# Patient Record
Sex: Female | Born: 2005 | Race: Black or African American | Hispanic: No | Marital: Single | Smoking: Never smoker
Health system: Southern US, Community
[De-identification: ages and names within clinical notes are randomized; demographics above are authoritative.]

## PROBLEM LIST (undated history)

## (undated) ENCOUNTER — Ambulatory Visit: Admission: EM | Disposition: A | Discharge status: Home / Self Care | Payer: Managed Care, Other (non HMO)

## (undated) ENCOUNTER — Ambulatory Visit: Source: Home / Self Care

## (undated) DIAGNOSIS — Z789 Other specified health status: Secondary | ICD-10-CM

## (undated) DIAGNOSIS — T7840XA Allergy, unspecified, initial encounter: Secondary | ICD-10-CM

## (undated) HISTORY — DX: Allergy, unspecified, initial encounter: T78.40XA

## (undated) HISTORY — DX: Other specified health status: Z78.9

## (undated) HISTORY — PX: NO PAST SURGERIES: SHX2092

---
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
## 2023-05-01 ENCOUNTER — Encounter: Payer: Self-pay | Admitting: Emergency Medicine

## 2023-05-01 ENCOUNTER — Ambulatory Visit
Admission: EM | Admit: 2023-05-01 | Discharge: 2023-05-01 | Disposition: A | Discharge status: Home / Self Care | Attending: Physician Assistant | Admitting: Physician Assistant

## 2023-05-01 DIAGNOSIS — Z113 Encounter for screening for infections with a predominantly sexual mode of transmission: Secondary | ICD-10-CM | POA: Insufficient documentation

## 2023-05-01 DIAGNOSIS — R102 Pelvic and perineal pain: Secondary | ICD-10-CM | POA: Diagnosis present

## 2023-05-01 LAB — POCT URINALYSIS DIP (MANUAL ENTRY)
Bilirubin, UA: NEGATIVE
Glucose, UA: NEGATIVE mg/dL
Ketones, POC UA: NEGATIVE mg/dL
Nitrite, UA: NEGATIVE
Protein Ur, POC: NEGATIVE mg/dL
Spec Grav, UA: 1.015 (ref 1.010–1.025)
Urobilinogen, UA: 0.2 U/dL
pH, UA: 7 (ref 5.0–8.0)

## 2023-05-01 LAB — POCT URINE PREGNANCY: Preg Test, Ur: NEGATIVE

## 2023-05-01 NOTE — ED Triage Notes (Signed)
 Pt reports cramping abdominal pain x5 days. Started having nausea and vomiting in the middle of the night. Last episode: 1 hr ago. Pt reports being 3 days late with her period. Pt has concerns for possible pregnancy or STI from her partner. No med use for symptoms.

## 2023-05-02 ENCOUNTER — Telehealth: Payer: Self-pay

## 2023-05-02 LAB — CERVICOVAGINAL ANCILLARY ONLY
Bacterial Vaginitis (gardnerella): POSITIVE — AB
Candida Glabrata: NEGATIVE
Candida Vaginitis: NEGATIVE
Chlamydia: POSITIVE — AB
Comment: NEGATIVE
Comment: NEGATIVE
Comment: NEGATIVE
Comment: NEGATIVE
Comment: NEGATIVE
Comment: NORMAL
Neisseria Gonorrhea: NEGATIVE
Trichomonas: NEGATIVE

## 2023-05-02 LAB — RPR: RPR Ser Ql: NONREACTIVE

## 2023-05-02 LAB — URINE CULTURE: Culture: <10000 — AB

## 2023-05-02 LAB — HIV ANTIBODY (ROUTINE TESTING W REFLEX): HIV Screen 4th Generation wRfx: NONREACTIVE

## 2023-05-02 MED ORDER — DOXYCYCLINE HYCLATE 100 MG PO TABS: 100.0000 mg | ORAL_TABLET | Freq: Two times a day (BID) | ORAL | 0 refills | Status: AC

## 2023-05-02 MED ORDER — METRONIDAZOLE 500 MG PO TABS: 500.0000 mg | ORAL_TABLET | Freq: Two times a day (BID) | ORAL | 0 refills | Status: AC

## 2023-05-02 NOTE — Telephone Encounter (Signed)
 Per protocol, pt requires tx with metronidazole and Doxycycline.  Attempted to reach patient x1. LVM.  Rx sent to pharmacy on file.

## 2023-05-05 NOTE — ED Provider Notes (Signed)
 [facility]    CSN: [id number] Arrival date & time: 05/01/23  6213      History   Chief Complaint Chief Complaint  Patient presents with   Abdominal Pain   Emesis    HPI Jaclyn Andrews is a 18 y.o. female.   Patient presents today for evaluation of lower abdominal cramping that started 5 days ago.  She also reports that last night she started to have some nausea and vomiting.  She states that her period is 3 days late.  She notes that she has some concerns for pregnancy or STD.  She denies any known exposures.  She has not taken any medication for symptoms.  The history is provided by the patient.  Abdominal Pain Associated symptoms: nausea and vomiting   Associated symptoms: no chills, no dysuria, no fever, no shortness of breath and no vaginal discharge   Emesis Associated symptoms: abdominal pain   Associated symptoms: no chills and no fever     Past Medical History:  Diagnosis Date   Allergy    COVID-19 09/21/2019   Medical history non-contributory     Patient Active Problem List   Diagnosis Date Noted   Mild preeclampsia delivered 06/21/2021   History of prior pregnancy with IUGR newborn 06/20/2021   Trauma during pregnancy 04/10/2021   Abdominal pain during intrauterine pregnancy 12/10/2020    Past Surgical History:  Procedure Laterality Date   CESAREAN SECTION N/A 06/21/2021   Procedure: CESAREAN SECTION;  Surgeon: Venora Maples, MD;  Location: [facility];  Service: Obstetrics;  Laterality: N/A;   NO PAST SURGERIES      OB History     Gravida  2   Para  1   Term  1   Preterm      AB  1   Living  1      SAB  1   IAB      Ectopic      Multiple  0   Live Births  1            Home Medications    Prior to Admission medications   Medication Sig Start Date End Date Taking? Authorizing Provider  doxycycline (VIBRA-TABS) 100 MG tablet Take 1 tablet (100 mg total) by mouth 2 (two) times daily for 7 days. 05/02/23  05/09/23  Zenia Resides, MD  doxycycline (VIBRAMYCIN) 100 MG capsule Take 1 capsule (100 mg total) by mouth 2 (two) times daily. Patient not taking: Reported on 05/01/2023 08/17/22   Merrilee Jansky, MD  metroNIDAZOLE (FLAGYL) 500 MG tablet Take 1 tablet (500 mg total) by mouth 2 (two) times daily for 7 days. 05/02/23 05/09/23  Zenia Resides, MD    Family History Family History  Problem Relation Age of Onset   Healthy Mother    Healthy Father     Social History Social History   Tobacco Use   Smoking status: Never   Smokeless tobacco: Never  Vaping Use   Vaping status: Former   Quit date: 04/11/2020   Substances: Nicotine  Substance Use Topics   Alcohol use: Yes    Comment: social   Drug use: Yes    Frequency: 7.0 times per week    Types: Marijuana     Allergies   Patient has no known allergies.   Review of Systems Review of Systems  Constitutional:  Negative for chills and fever.  Eyes:  Negative for discharge and redness.  Respiratory:  Negative for  shortness of breath.   Gastrointestinal:  Positive for abdominal pain, nausea and vomiting.  Genitourinary:  Positive for menstrual problem. Negative for dysuria and vaginal discharge.     Physical Exam Triage Vital Signs ED Triage Vitals [05/01/23 0958]  Encounter Vitals Group     BP 122/85     Systolic BP Percentile      Diastolic BP Percentile      Pulse Rate 74     Resp 16     Temp 97.6 F (36.4 C)     Temp Source Oral     SpO2 99 %     Weight      Height      Head Circumference      Peak Flow      Pain Score 3     Pain Loc      Pain Education      Exclude from Growth Chart    No data found.  Updated Vital Signs BP 122/85 (BP Location: Right Arm)   Pulse 74   Temp 97.6 F (36.4 C) (Oral)   Resp 16   LMP 03/31/2023 (Exact Date)   SpO2 99%   Breastfeeding No   Visual Acuity Right Eye Distance:   Left Eye Distance:   Bilateral Distance:    Right Eye Near:   Left Eye Near:     Bilateral Near:     Physical Exam Vitals and nursing note reviewed.  Constitutional:      General: She is not in acute distress.    Appearance: Normal appearance. She is not ill-appearing.  HENT:     Head: Normocephalic and atraumatic.  Eyes:     Conjunctiva/sclera: Conjunctivae normal.  Cardiovascular:     Rate and Rhythm: Normal rate.  Pulmonary:     Effort: Pulmonary effort is normal. No respiratory distress.  Abdominal:     General: Abdomen is flat. Bowel sounds are normal. There is no distension.     Tenderness: There is abdominal tenderness (mild TTP to lower abdomen/ suprapubic area). There is no guarding or rebound.  Neurological:     Mental Status: She is alert.  Psychiatric:        Mood and Affect: Mood normal.        Behavior: Behavior normal.        Thought Content: Thought content normal.      UC Treatments / Results  Labs (all labs ordered are listed, but only abnormal results are displayed) Labs Reviewed  URINE CULTURE - Abnormal; Notable for the following components:      Result Value   Culture   (*)    Value: <10,000 COLONIES/mL INSIGNIFICANT GROWTH Performed at [facility], [address] [place], [place] [address]    All other components within normal limits  POCT URINALYSIS DIP (MANUAL ENTRY) - Abnormal; Notable for the following components:   Color, UA light yellow (*)    Clarity, UA cloudy (*)    Blood, UA trace-intact (*)    Leukocytes, UA Trace (*)    All other components within normal limits  CERVICOVAGINAL ANCILLARY ONLY - Abnormal; Notable for the following components:   Chlamydia Positive (*)    Bacterial Vaginitis (gardnerella) Positive (*)    All other components within normal limits  POCT URINE PREGNANCY - Normal  RPR   Narrative:    Performed at:  [address] [facility] [address] [place], [place]  [id number] Lab Director: Jolene Schimke MD, Phone:  [phone]  HIV ANTIBODY (ROUTINE TESTING W REFLEX)   Narrative:     Performed at:  [address] [facility] [address], [place], [place]  [id number] Lab Director: Jolene Schimke MD, Phone:  [phone]    EKG   Radiology No results found.  Procedures Procedures (including critical care time)  Medications Ordered in UC Medications - No data to display  Initial Impression / Assessment and Plan / UC Course  I have reviewed the triage vital signs and the nursing notes.  Pertinent labs & imaging results that were available during my care of the patient were reviewed by me and considered in my medical decision making (see chart for details).   Will order urine culture as well as STD screening.  Will await results for further recommendation.  Pregnancy test negative in office.  Advised follow-up with any further concerns.   Final Clinical Impressions(s) / UC Diagnoses   Final diagnoses:  Suprapubic pain  Screening for STD (sexually transmitted disease)   Discharge Instructions   None    ED Prescriptions   None    PDMP not reviewed this encounter.   Tomi Bamberger, PA-C 05/05/23 1015

## 2023-05-19 ENCOUNTER — Telehealth (HOSPITAL_COMMUNITY): Payer: Self-pay

## 2023-05-19 MED ORDER — AZITHROMYCIN 250 MG PO TABS: 1000.0000 mg | ORAL_TABLET | Freq: Once | ORAL | 0 refills | Status: AC

## 2023-05-19 MED ORDER — METRONIDAZOLE 0.75 % VA GEL: 1.0000 | Freq: Every day | VAGINAL | 0 refills | Status: AC

## 2023-05-19 NOTE — Telephone Encounter (Signed)
 Pt states she has been unable to tolerate previous BV and chlamydia meds. Reviewed with patient, verified pharmacy, prescription sent

## 2023-06-26 ENCOUNTER — Encounter: Payer: Self-pay | Admitting: Family Medicine

## 2023-06-26 ENCOUNTER — Ambulatory Visit (INDEPENDENT_AMBULATORY_CARE_PROVIDER_SITE_OTHER): Admitting: Family Medicine

## 2023-06-26 VITALS — BP 113/72 | HR 96 | Wt 100.0 lb

## 2023-06-26 DIAGNOSIS — Z30016 Encounter for initial prescription of transdermal patch hormonal contraceptive device: Secondary | ICD-10-CM | POA: Insufficient documentation

## 2023-06-26 MED ORDER — NORELGESTROMIN-ETH ESTRADIOL 150-35 MCG/24HR TD PTWK: 1.0000 | MEDICATED_PATCH | TRANSDERMAL | 3 refills | Status: DC

## 2023-06-26 NOTE — Assessment & Plan Note (Signed)
 No recent unprotected sex. Discussed running packs together to not have menses. Does not smoke. Advised to wait until next period and then start about a week after.

## 2023-06-26 NOTE — Progress Notes (Signed)
   [facility] GYN[facility]Y OFFICE VISIT NOTE  History:    Shadia Ralph is a 18 y.o. G2P1011 here today for contraception counseling.  Has tried depo but didn't like it LMP 06/07/2023, last sexually active but used protection about two weeks ago (around a week after LMP) Interested in the patch She does not smoke, has never had a blood clot before  Health Maintenance Due  Topic Date Due   Meningococcal B Vaccine (1 of 2 - Standard) Never done    Past Medical History:  Diagnosis Date   Allergy    COVID-19 09/21/2019   Medical history non-contributory     Past Surgical History:  Procedure Laterality Date   CESAREAN SECTION N/A 06/21/2021   Procedure: CESAREAN SECTION;  Surgeon: Teena Feast, MD;  Location: [facility] LD ORS;  Service: Obstetrics;  Laterality: N/A;   NO PAST SURGERIES      The following portions of the patient's history were reviewed and updated as appropriate: allergies, current medications, past family history, past medical history, past social history, past surgical history and problem list.   Health Maintenance:   Last pap: No results found for: "DIAGPAP", "HPV", "HPVHIGH" N/a  Last mammogram:  N/a    Review of Systems:  Pertinent items noted in HPI and remainder of comprehensive ROS otherwise negative.  Physical Exam:  BP 113/72   Pulse 96   Wt 100 lb (45.4 kg)   BMI 19.53 kg/m  CONSTITUTIONAL: Well-developed, well-nourished female in no acute distress.  HEENT:  Normocephalic, atraumatic. External right and left ear normal. No scleral icterus.  NECK: Normal range of motion, supple, no masses noted on observation SKIN: No rash noted. Not diaphoretic. No erythema. No pallor. MUSCULOSKELETAL: Normal range of motion. No edema noted. NEUROLOGIC: Alert and oriented to person, place, and time. Normal muscle tone coordination.  PSYCHIATRIC: Normal mood and affect. Normal behavior. Normal judgment and thought content. RESPIRATORY: Effort  normal, no problems with respiration noted  Labs and Imaging No results found for this or any previous visit (from the past week). No results found.    Assessment and Plan:   Problem List Items Addressed This Visit       Other   Encounter for initial prescription of transdermal patch hormonal contraceptive device - Primary   No recent unprotected sex. Discussed running packs together to not have menses. Does not smoke. Advised to wait until next period and then start about a week after.       Relevant Medications   norelgestromin-ethinyl estradiol (XULANE) 150-35 MCG/24HR transdermal patch    Routine preventative health maintenance measures emphasized. Please refer to After Visit Summary for other counseling recommendations.   Return for as needed.    Total face-to-face time with patient: 20 minutes.  Over 50% of encounter was spent on counseling and coordination of care.   Teena Feast, MD/MPH Attending Family Medicine Physician, [facility] for [facility], [facility]

## 2023-08-16 IMAGING — US US OB TRANSVAGINAL
1 series · 15 of 28 positions shown · non-contrast
Comparison: 10/30/2020

CLINICAL DATA: Pregnant, unknown gestational age

EXAM:
OBSTETRIC <14 WK ULTRASOUND
TECHNIQUE: Transabdominal ultrasound was performed for evaluation of the
gestation as well as the maternal uterus and adnexal regions.

[Series 1: us ob transvaginal · 51 acquisitions, 15 frames shown]
[im 1/51]
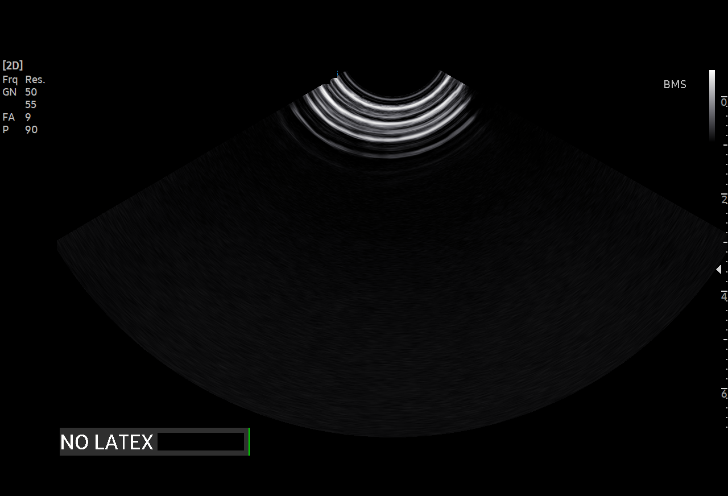
[im 4/51]
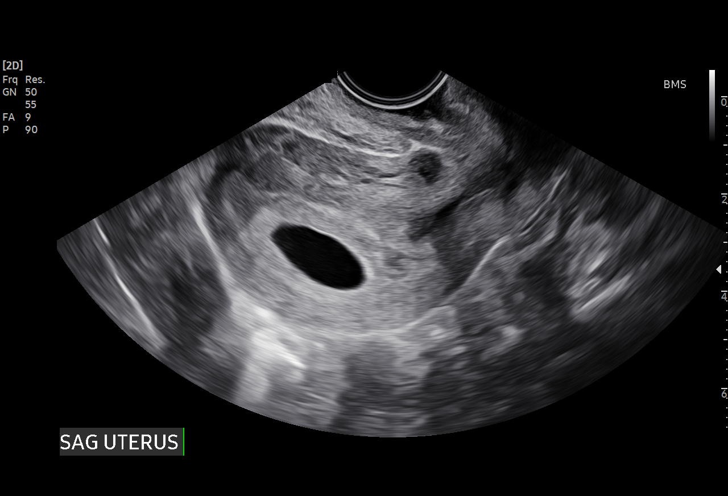
[im 8/51]
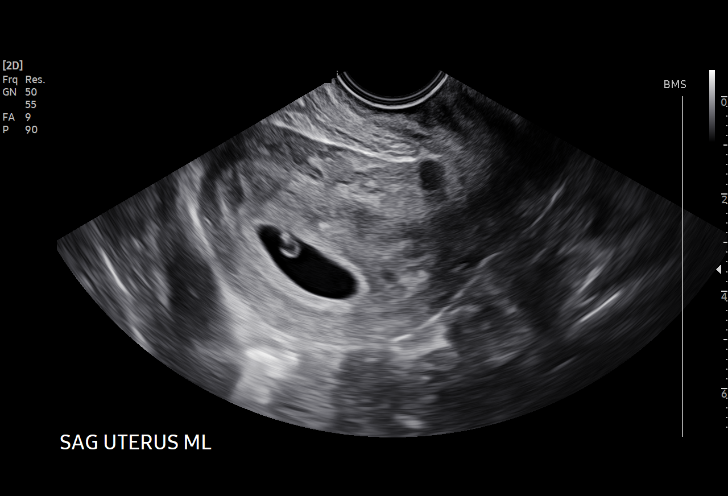
[im 12/51]
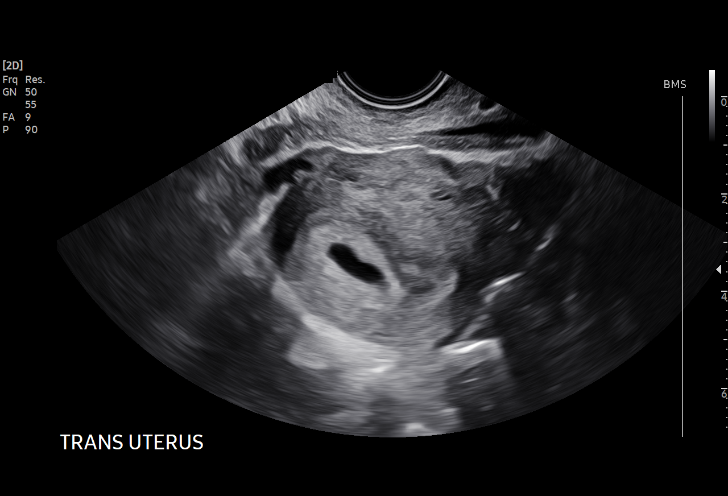
[im 15/51]
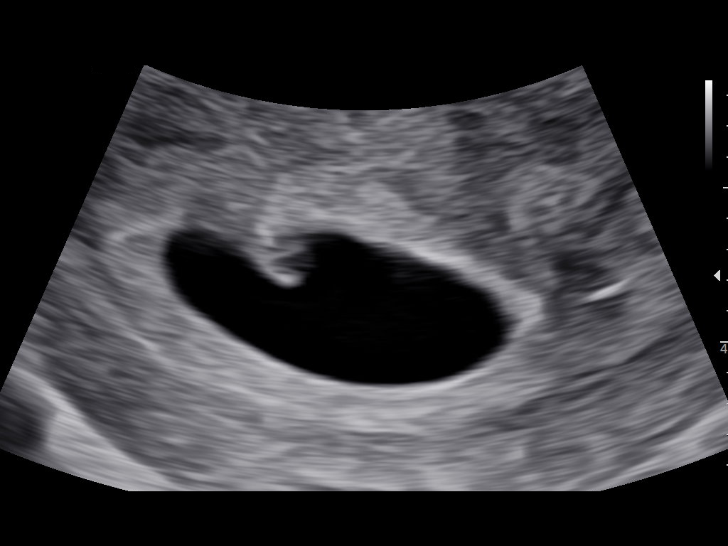
[im 19/51]
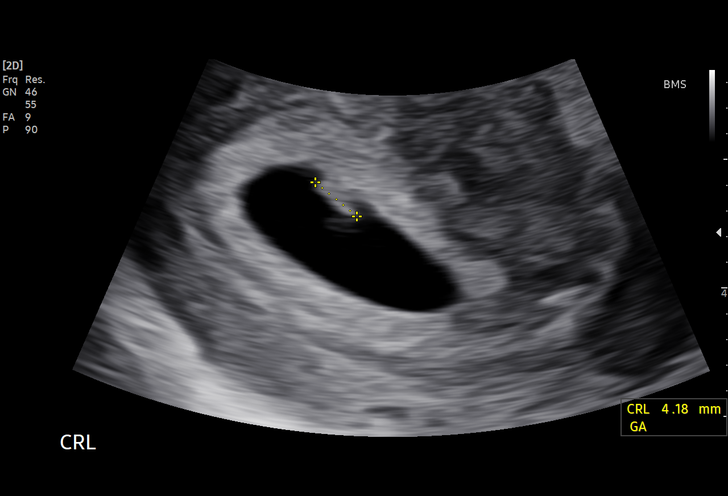
[im 23/51]
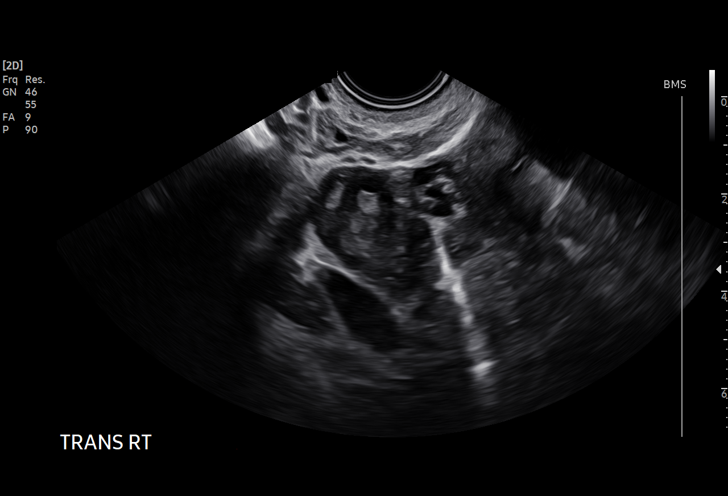
[im 26/51]
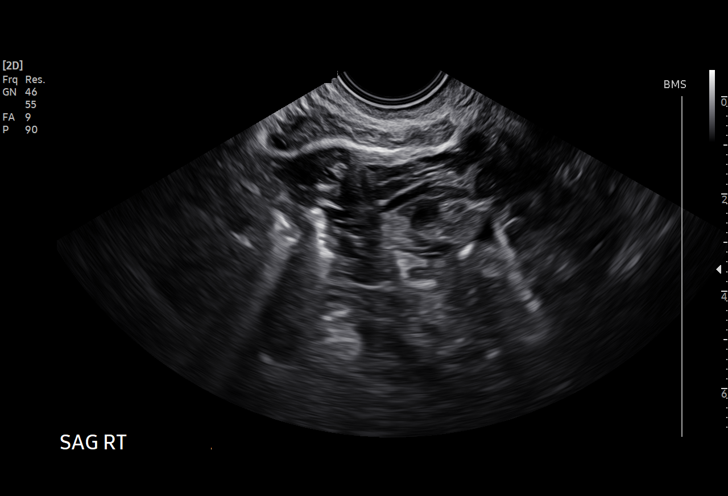
[im 28/51]
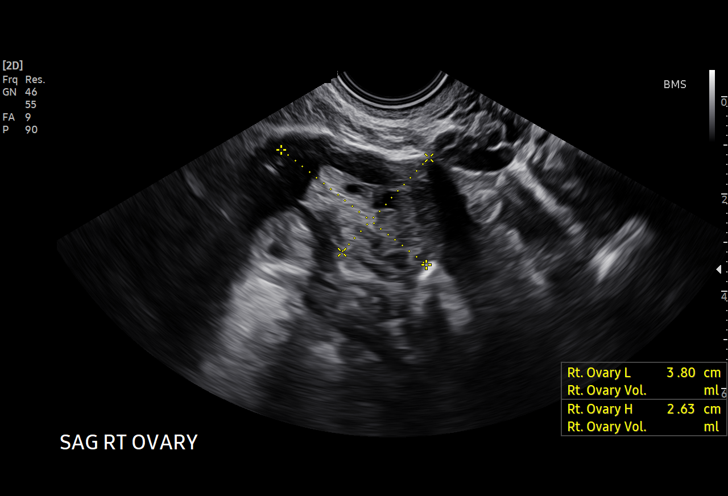
[im 32/51]
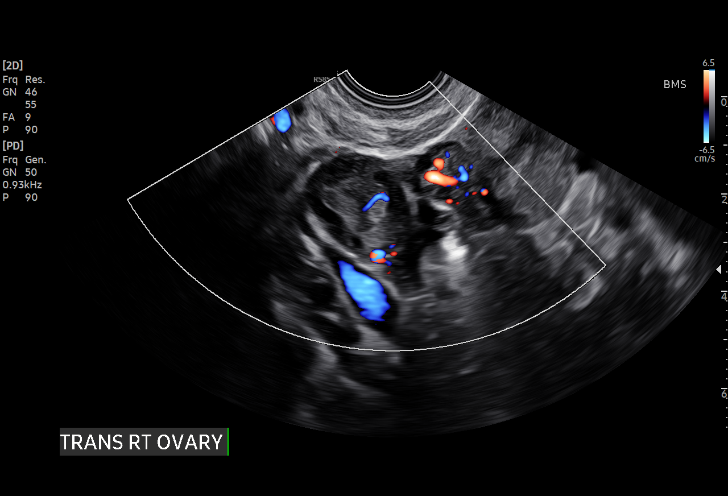
[im 36/51]
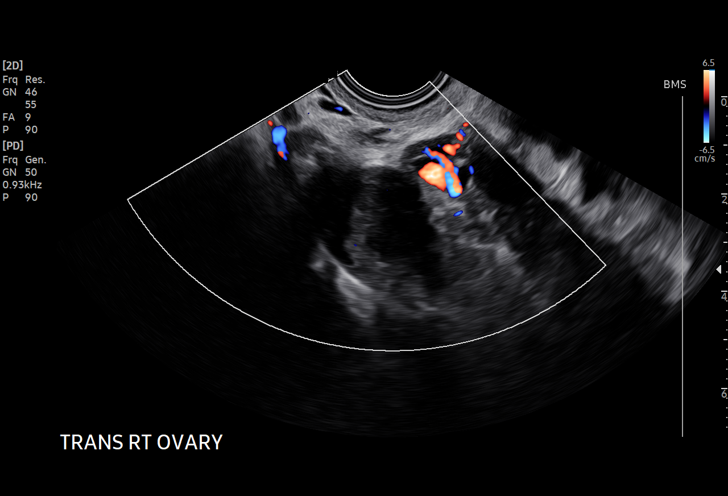
[im 39/51]
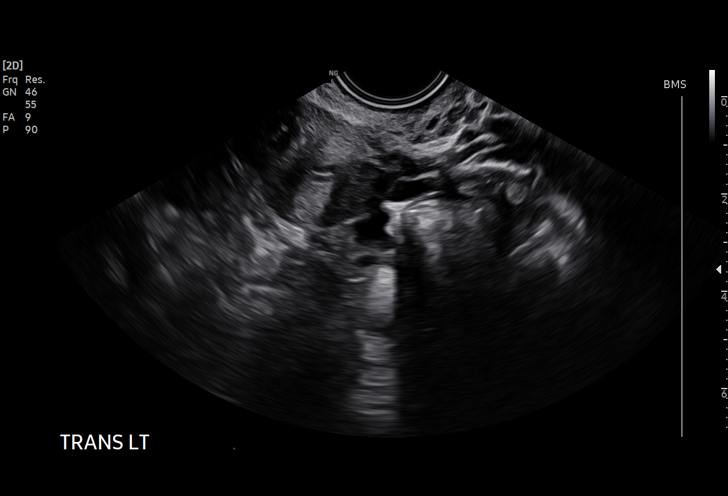
[im 43/51]
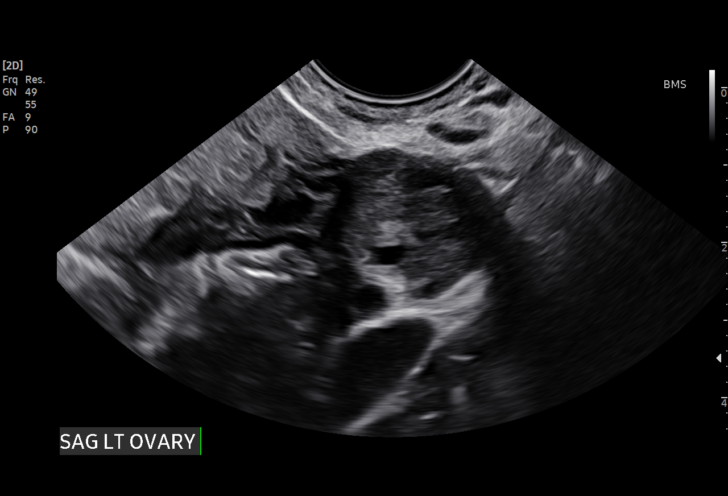
[im 47/51]
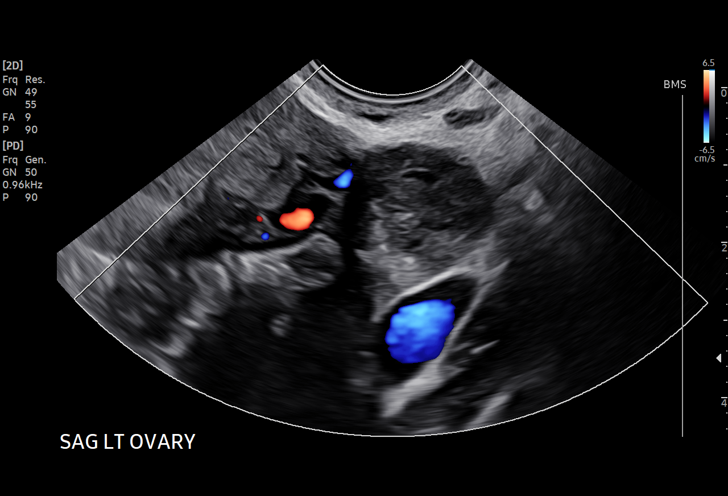
[im 51/51]
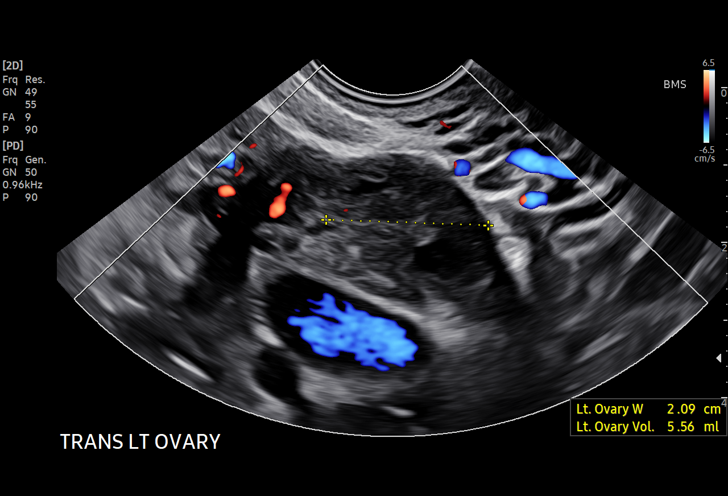

[15 of 28 positions shown; findings below may reference images not displayed]

FINDINGS: Intrauterine gestational sac: Single

Yolk sac:  Visualized.

Embryo:  Visualized.

Cardiac Activity: Visualized.

Heart Rate: 114 bpm

CRL:   4.1 mm   6 w 1 d                  US EDC: 07/04/2021

Subchorionic hemorrhage:  None visualized.

Maternal uterus/adnexae: Right ovary measures 3.8 x 2.6 x 2.5 cm
with a probable corpus luteum cyst. Left ovary measures 2.1 x 2.5 x
2.1 cm. No free fluid.
IMPRESSION: 1. Single live intrauterine pregnancy as above estimated age 6 weeks
and 1 day.

## 2023-09-16 IMAGING — US US ART/VEN ABD/PELV/SCROTUM DOPPLER LTD
1 series · 15 of 25 positions shown · non-contrast
Comparison: Prior ultrasound from 11/09/2020.

CLINICAL DATA: Initial evaluation for acute right lower quadrant
pain, evaluate for torsion.

EXAM:
OBSTETRIC <14 WK US OB
US DOPPLER ULTRASOUND OF OVARIES
TECHNIQUE: Transabdominal ultrasound examinations were performed for complete
evaluation of the gestation as well as the maternal uterus, adnexal
regions, and pelvic cul-de-sac.
Color and duplex Doppler ultrasound was utilized to evaluate blood
flow to the ovaries.

[Series 1: us art/ven abd/pelv/scrotum doppler ltd · 15 of 26 slices shown]
[im 1/26]
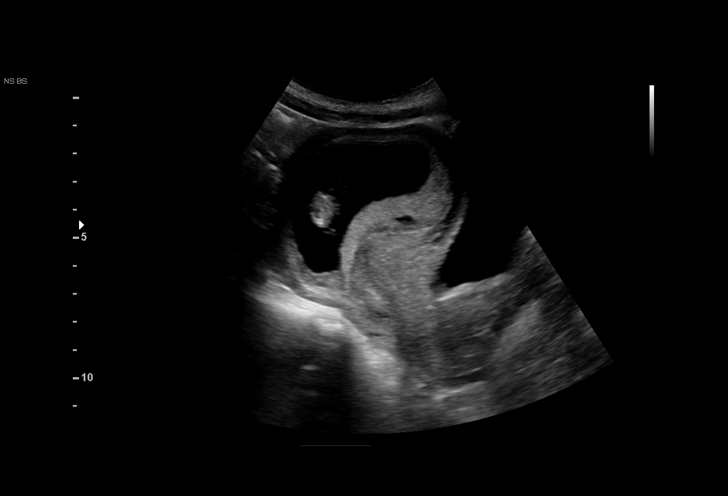
[im 3/26]
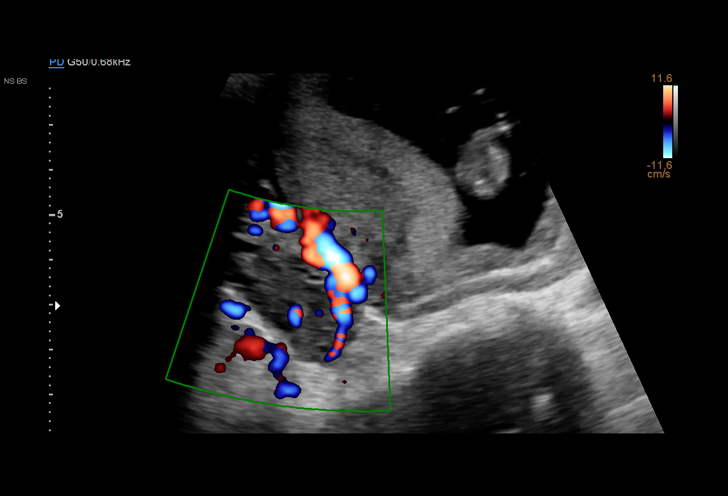
[im 5/26]
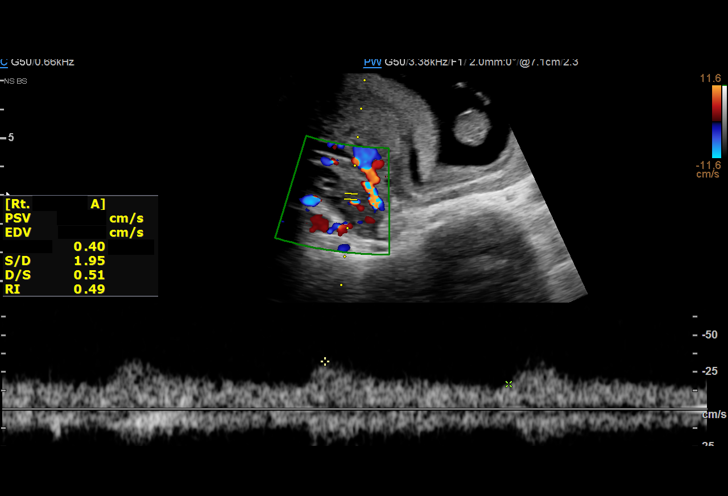
[im 6/26]
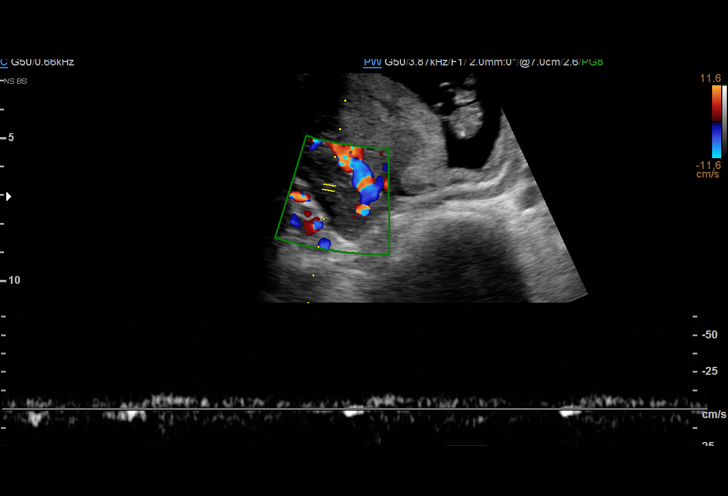
[im 8/26]
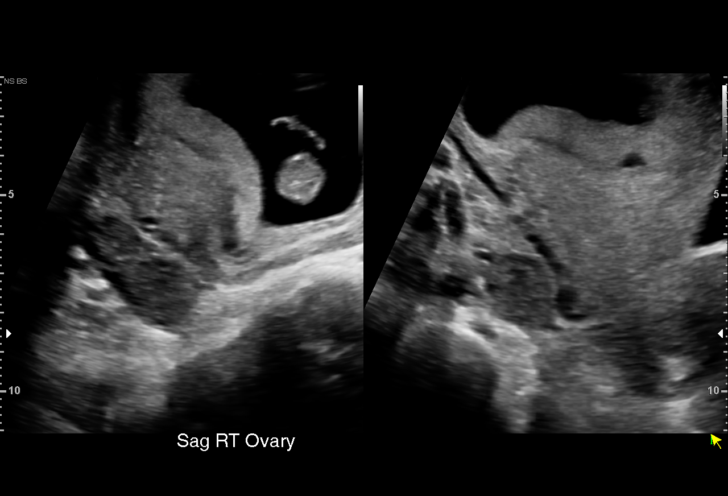
[im 10/26]
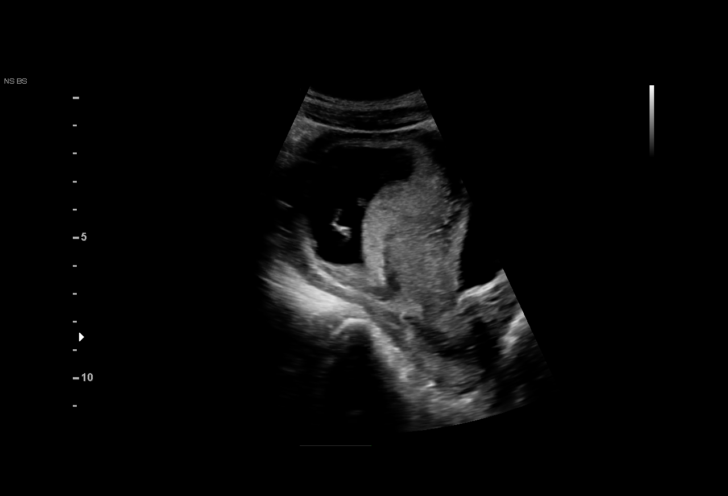
[im 11/26]
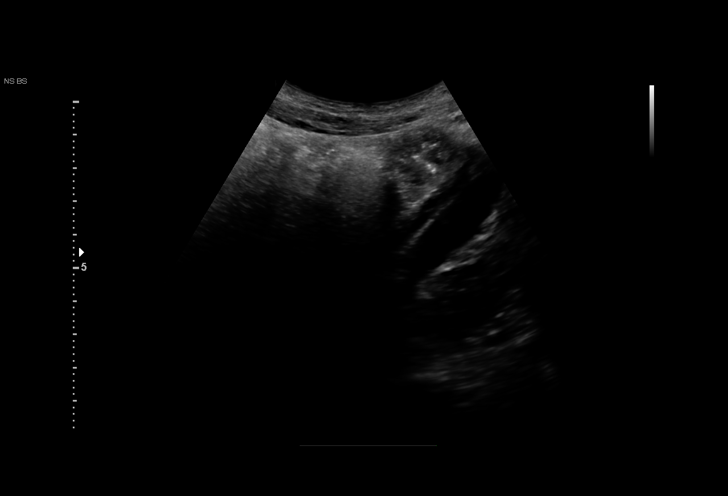
[im 13/26]
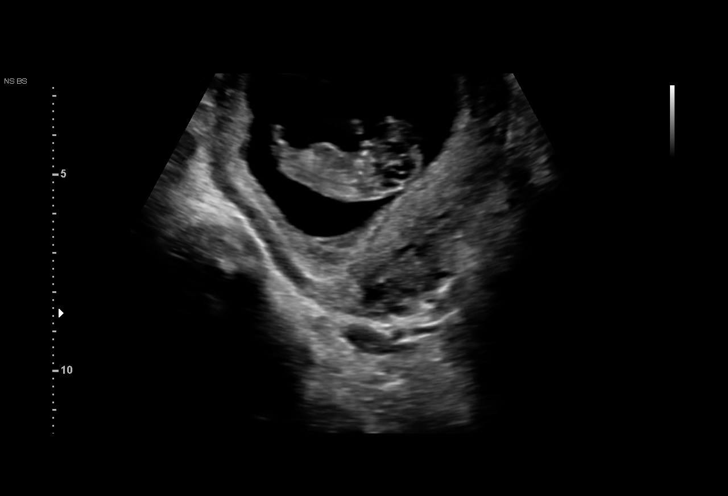
[im 15/26]
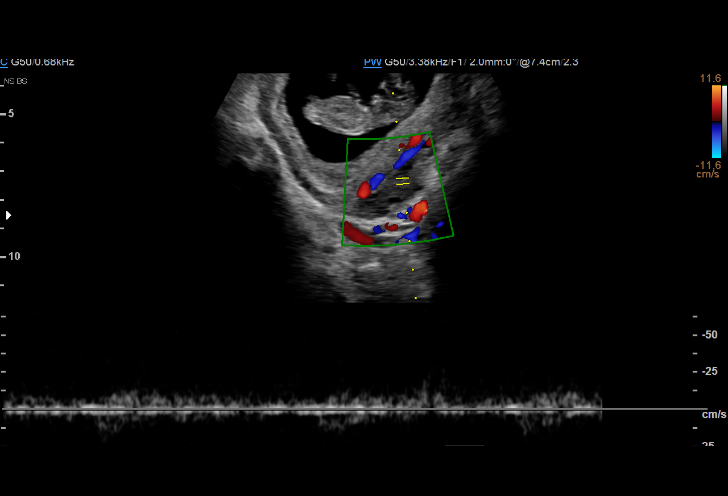
[im 16/26]
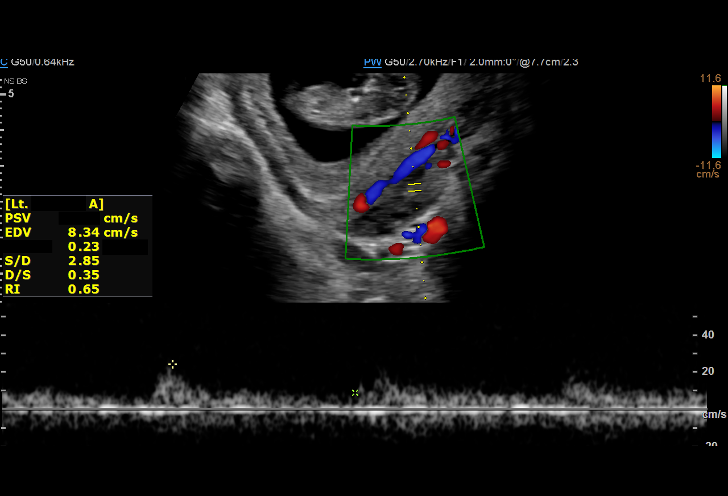
[im 18/26]
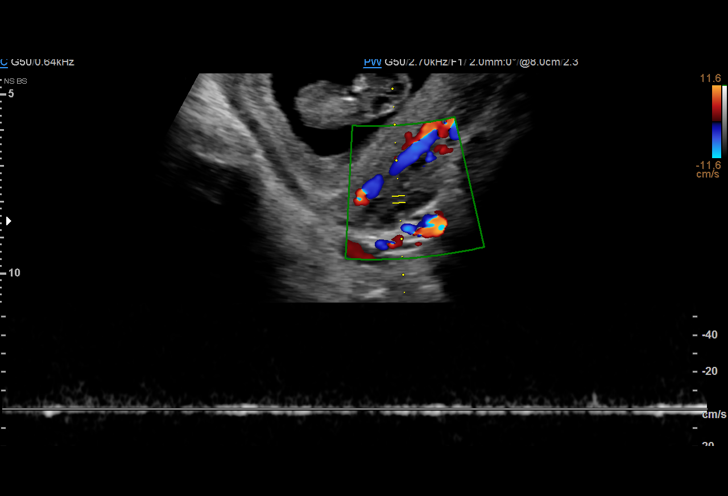
[im 20/26]
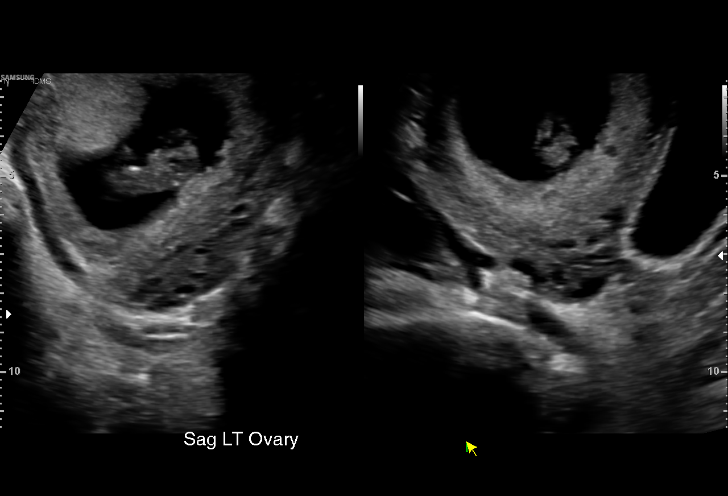
[im 21/26]
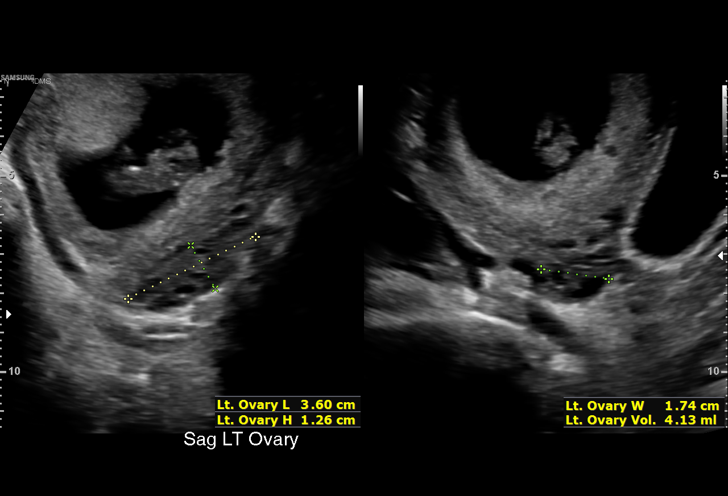
[im 23/26]
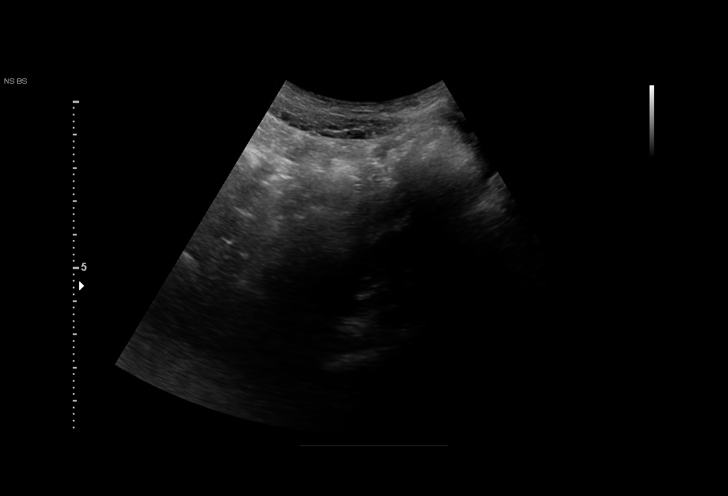
[im 26/26]
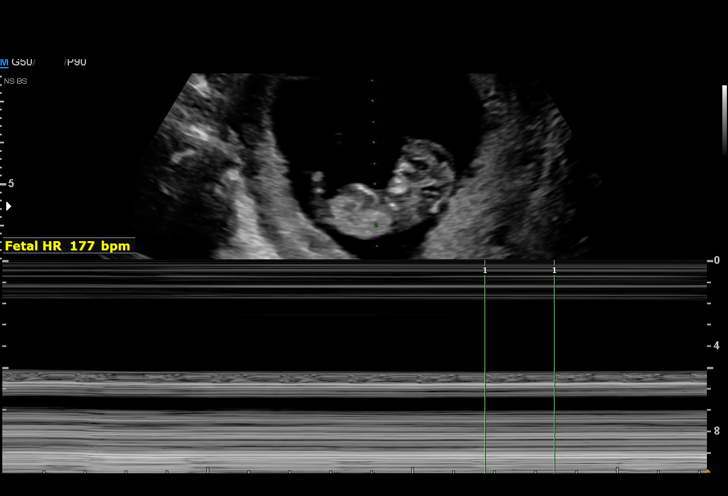

[15 of 25 positions shown; findings below may reference images not displayed]

FINDINGS: Intrauterine gestational sac: Present.

Yolk sac:  Present.

Embryo:  Present.

Cardiac Activity: Present.

Heart Rate: 177 bpm

CRL:   38.7 mm   10 w 5 d                  US EDC: 07/03/2021

Subchorionic hemorrhage:  None visualized.

Maternal uterus/adnexae:

Right ovary within normal limits measuring 3.5 x 1.7 x 1.8 cm,
volume = 5.6 mL.

Left ovary within normal limits measuring 3.6 x 1.3 x 1.7 cm, volume
= 4.1 mL.

No adnexal mass or free fluid.

Pulsed Doppler evaluation of both ovaries demonstrates normal
appearing low-resistance arterial and venous waveforms.
IMPRESSION: 1. Single viable IUP, estimated gestational age 10 weeks and 5 days.
No complication.
2. No other acute maternal uterine or adnexal abnormality. No
ovarian cyst or other adnexal mass. No evidence for torsion.

## 2023-09-16 IMAGING — US US ABDOMEN LIMITED
1 series · 15 of 25 positions shown · non-contrast
Comparison: None.

CLINICAL DATA: Initial evaluation for acute right upper and lower
quadrant abdominal pain. Early pregnancy.

EXAM:
ULTRASOUND ABDOMEN LIMITED RIGHT UPPER QUADRANT

[Series 1: us abdomen limited · 15 of 40 slices shown]
[im 1/40]
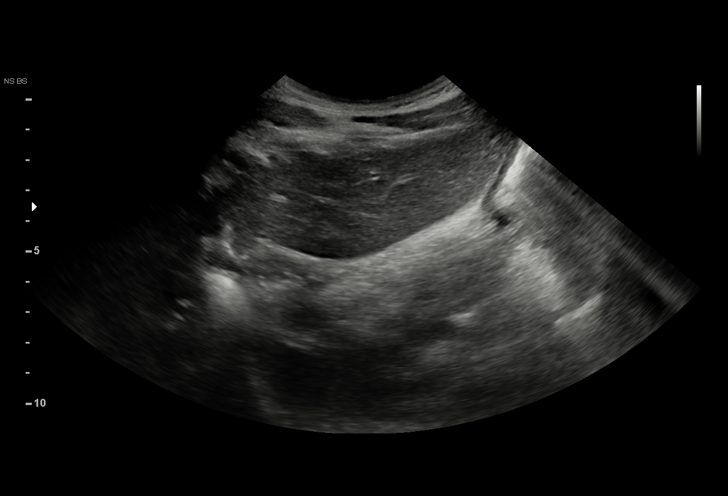
[im 4/40]
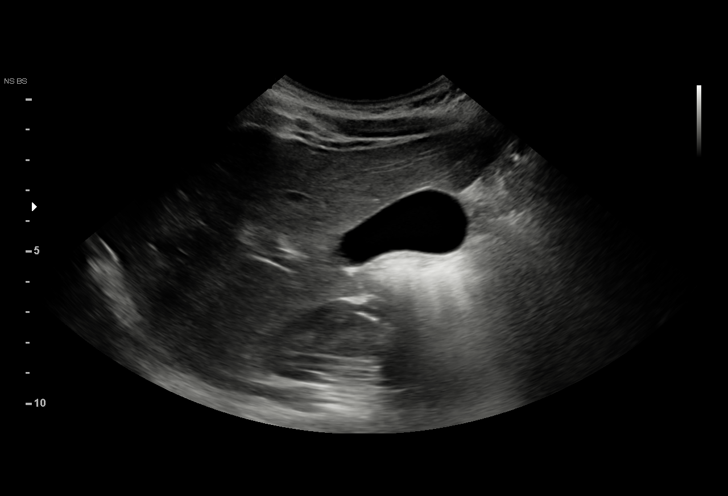
[im 7/40]
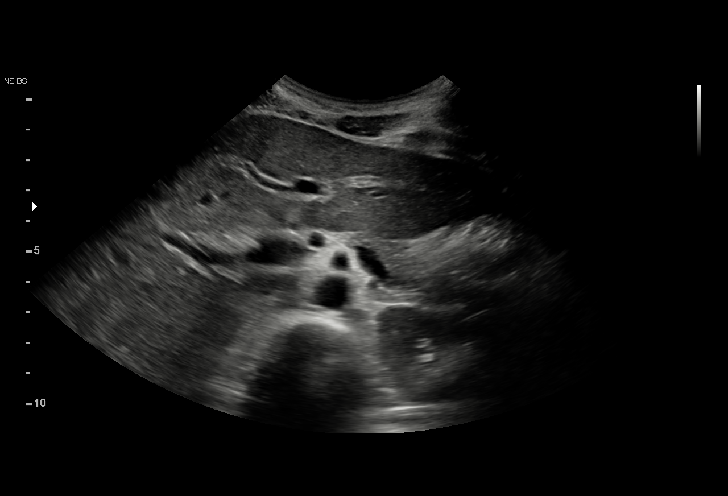
[im 9/40]
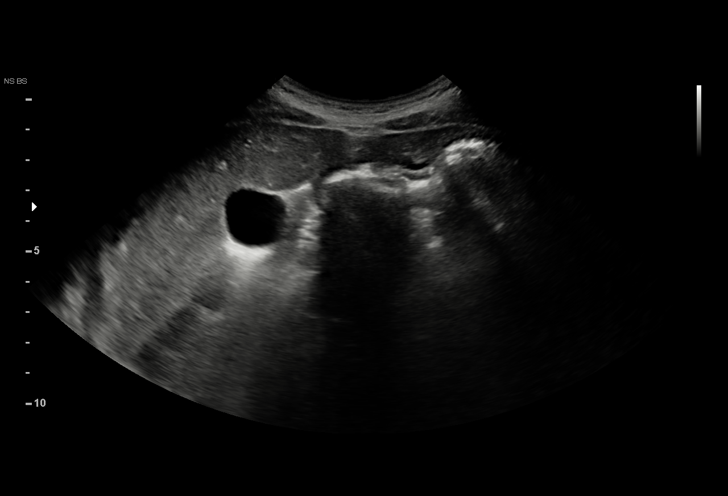
[im 12/40]
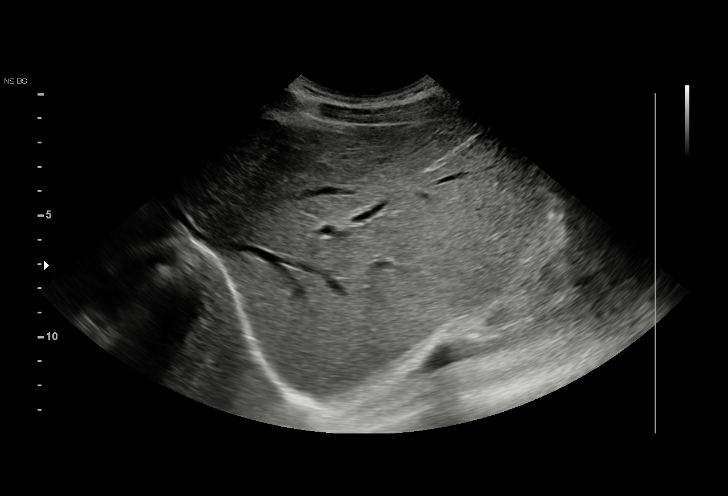
[im 15/40]
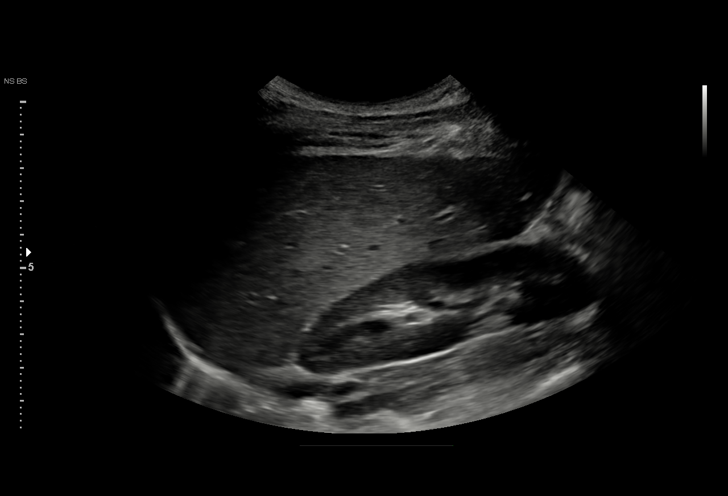
[im 17/40]
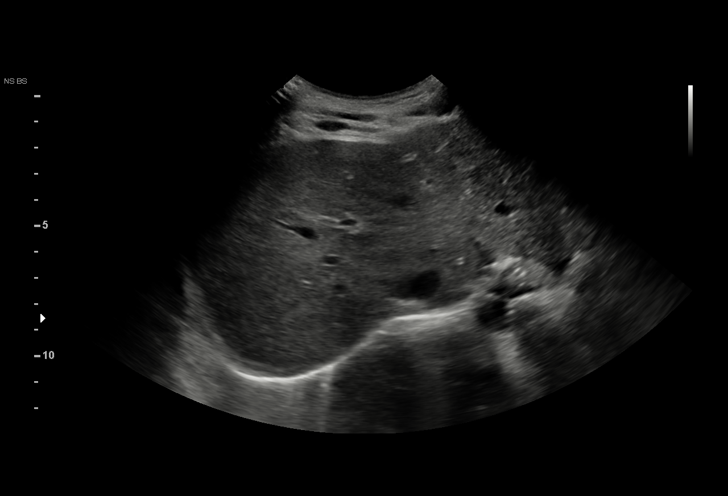
[im 20/40]
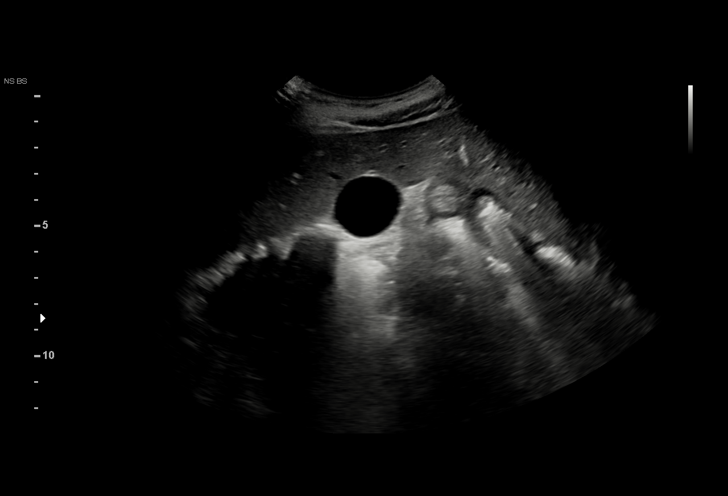
[im 23/40]
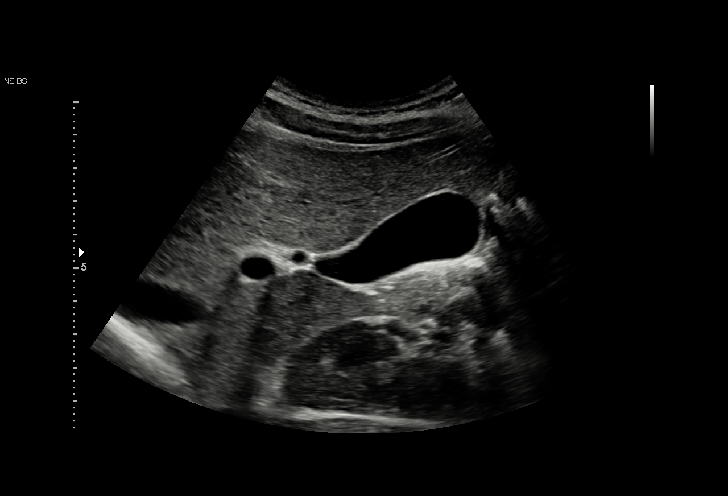
[im 25/40]
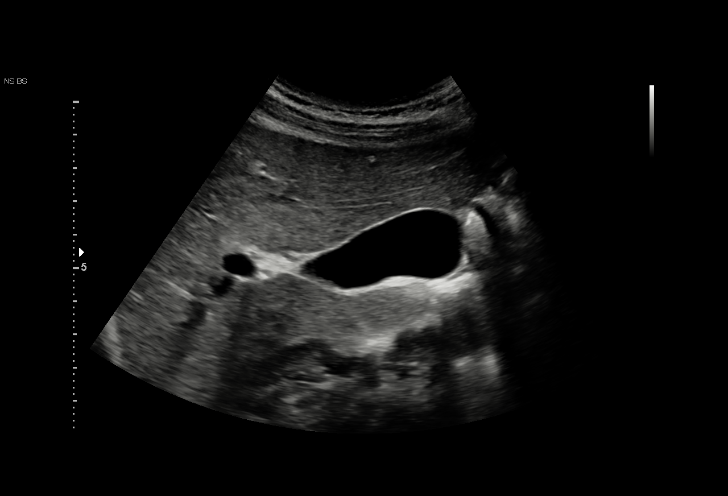
[im 28/40]
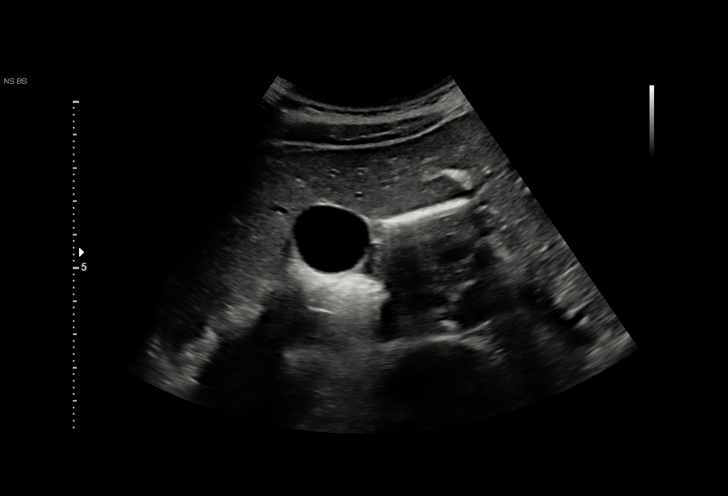
[im 31/40]
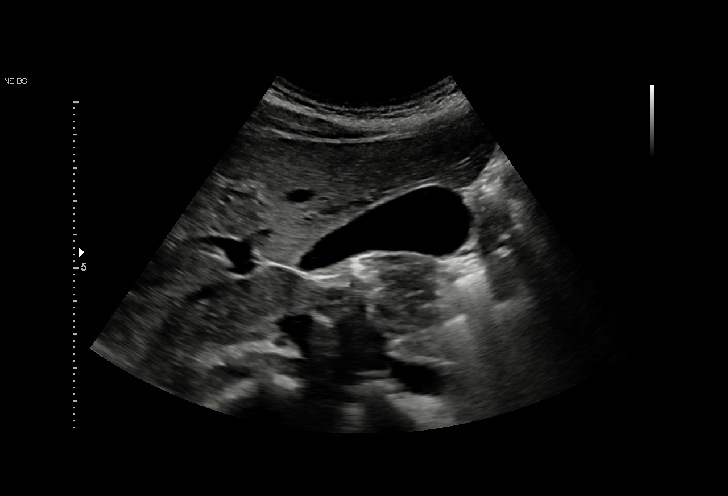
[im 33/40]
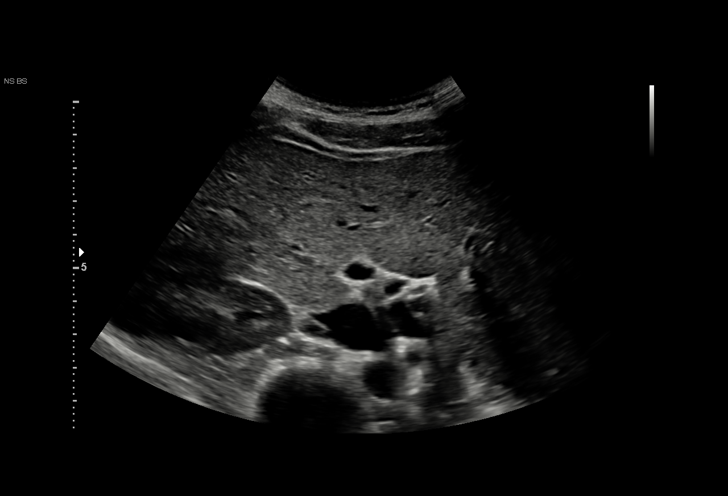
[im 36/40]
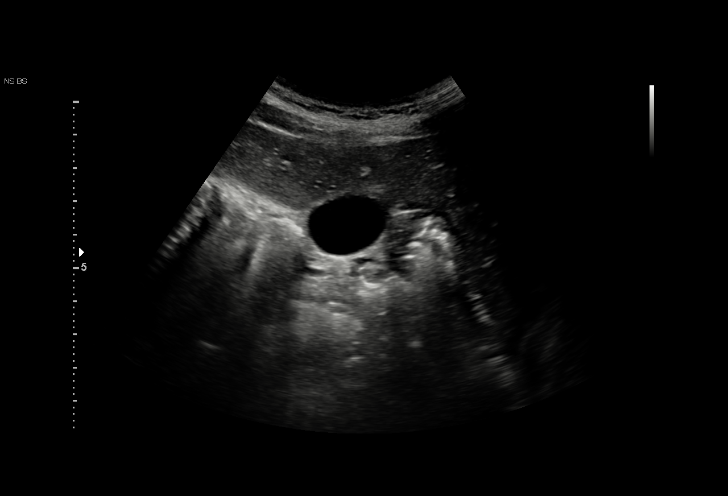
[im 40/40]
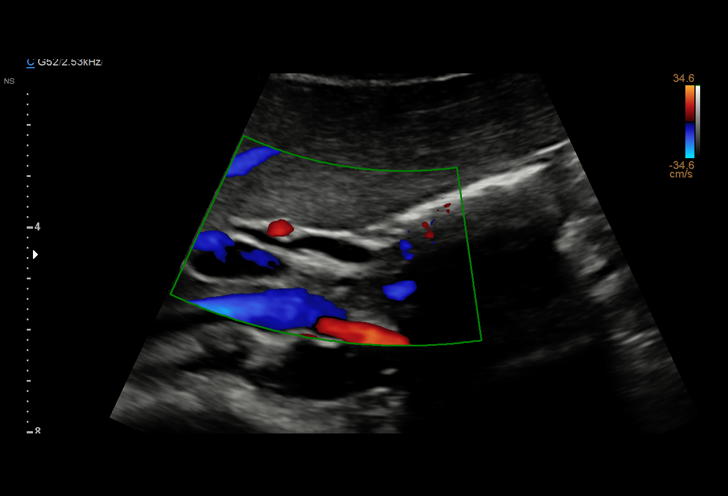

[15 of 25 positions shown; findings below may reference images not displayed]

FINDINGS: Gallbladder:

No gallstones or wall thickening visualized. No sonographic Murphy
sign noted by sonographer.

Common bile duct:

Diameter: 4 mm

Liver:

No focal lesion identified. Within normal limits in parenchymal
echogenicity. Portal vein is patent on color Doppler imaging with
normal direction of blood flow towards the liver.

Other: None.
IMPRESSION: Normal right upper quadrant ultrasound.

## 2023-10-23 ENCOUNTER — Other Ambulatory Visit: Payer: Self-pay

## 2023-10-23 ENCOUNTER — Emergency Department (HOSPITAL_COMMUNITY)
Admission: EM | Admit: 2023-10-23 | Discharge: 2023-10-23 | Disposition: A | Discharge status: Home / Self Care | Attending: Emergency Medicine | Admitting: Emergency Medicine

## 2023-10-23 ENCOUNTER — Emergency Department (HOSPITAL_COMMUNITY)

## 2023-10-23 ENCOUNTER — Encounter (HOSPITAL_COMMUNITY): Payer: Self-pay

## 2023-10-23 DIAGNOSIS — J069 Acute upper respiratory infection, unspecified: Secondary | ICD-10-CM | POA: Diagnosis not present

## 2023-10-23 DIAGNOSIS — R059 Cough, unspecified: Secondary | ICD-10-CM | POA: Diagnosis present

## 2023-10-23 LAB — BASIC METABOLIC PANEL WITH GFR
Anion gap: 12 (ref 5–15)
BUN: <5 mg/dL — ABNORMAL LOW (ref 6–20)
CO2: 20 mmol/L — ABNORMAL LOW (ref 22–32)
Calcium: 8.8 mg/dL — ABNORMAL LOW (ref 8.9–10.3)
Chloride: 104 mmol/L (ref 98–111)
Creatinine, Ser: 0.6 mg/dL (ref 0.44–1.00)
GFR, Estimated: >60 mL/min (ref >60)
Glucose, Bld: 99 mg/dL (ref 70–99)
Potassium: 3.9 mmol/L (ref 3.5–5.1)
Sodium: 136 mmol/L (ref 135–145)

## 2023-10-23 LAB — CBC
HCT: 38 % (ref 36.0–46.0)
Hemoglobin: 11.9 g/dL — ABNORMAL LOW (ref 12.0–15.0)
MCH: 27.5 pg (ref 26.0–34.0)
MCHC: 31.3 g/dL (ref 30.0–36.0)
MCV: 87.8 fL (ref 80.0–100.0)
Platelets: 243 K/uL (ref 150–400)
RBC: 4.33 MIL/uL (ref 3.87–5.11)
RDW: 12.8 % (ref 11.5–15.5)
WBC: 9.8 K/uL (ref 4.0–10.5)
nRBC: 0 % (ref 0.0–0.2)

## 2023-10-23 LAB — TROPONIN T, HIGH SENSITIVITY: Troponin T High Sensitivity: <15 ng/L (ref 0–19)

## 2023-10-23 LAB — HCG, SERUM, QUALITATIVE: Preg, Serum: NEGATIVE

## 2023-10-23 LAB — RESP PANEL BY RT-PCR (RSV, FLU A&B, COVID)  RVPGX2
Influenza A by PCR: NEGATIVE
Influenza B by PCR: NEGATIVE
Resp Syncytial Virus by PCR: NEGATIVE
SARS Coronavirus 2 by RT PCR: NEGATIVE

## 2023-10-23 MED ORDER — IBUPROFEN 800 MG PO TABS
800.0000 mg | ORAL_TABLET | Freq: Once | ORAL | Status: DC
  Filled 2023-10-23: qty 1

## 2023-10-23 MED ORDER — GUAIFENESIN-CODEINE 100-10 MG/5ML PO SOLN
5.0000 mL | Freq: Once | ORAL | Status: AC
  Administered 2023-10-23: 5 mL via ORAL
  Filled 2023-10-23: qty 5

## 2023-10-23 MED ORDER — PROMETHAZINE-DM 6.25-15 MG/5ML PO SYRP: 5.0000 mL | ORAL_SOLUTION | Freq: Four times a day (QID) | ORAL | 0 refills | Status: AC | PRN

## 2023-10-23 NOTE — Discharge Instructions (Addendum)
 Your work-up in the ER today was reassuring for acute findings. You were swabbed for COVID, flu, and RSV.  Which were negative. However, your symptoms are still likely related to an upper respiratory infection. As these are almost always viral in nature, no antibiotics are indicated. I recommend that you get plenty of rest and focus on symptomatic relief which includes Cepacol throat lozenges for sore throat, Mucinex  D (orange box) which you can get from behind the counter at your local pharmacy for congestion, and tylenol /ibuprofen  as needed for fevers and bodyaches.  Please use acetaminophen  (Tylenol ) or ibuprofen  (Advil , Motrin ) for pain.  You may use 800 mg ibuprofen  every 6 hours or 1000 mg of acetaminophen  every 6 hours.  You may choose to alternate between the two, this would be most effective. Do not exceed 4000 mg of acetaminophen  within 24 hours.  Do not exceed 3200 mg ibuprofen  within 24 hours.  I have also given you a prescription for cough syrup which is a cough suppressant medication for you to take as prescribed as needed for management of your symptoms.  Do not drive or operate heavy machinery while taking this medication as it can be sedating.  I also recommend:  Increased fluid intake. Sports drinks offer valuable electrolytes, sugars, and fluids.  Breathing heated mist or steam (vaporizer or shower).  Eating chicken soup or other clear broths, and maintaining good nutrition.   Increasing usage of your inhaler if you have asthma.  Return to work when your temperature has returned to normal.  Gargle warm salt water  and spit it out for sore throat. Take benadryl  or Zyrtec to decrease sinus secretions.  Follow Up: Follow up with your primary care doctor in 5-7 days for recheck of ongoing symptoms.  Return to emergency department for emergent changing or worsening of symptoms.

## 2023-10-23 NOTE — ED Provider Notes (Signed)
 [place] EMERGENCY DEPARTMENT AT [facility] Provider Note   CSN: [id number] Arrival date & time: 10/23/23  [id number]     Patient presents with: Chest Pain   Jaclyn Andrews is a 18 y.o. female.   Patient with no pertinent past medical history presents today with complaints of cough, congestion. Reports that symptoms began yesterday. She has been managing with Mucinex  and Nyquil with minimal improvement. Denies any fevers or chills. No sick contacts. Reports that today she was coughing and her chest started to hurt. Reports that her chest only hurts when she is coughing. Denies any shortness of breath. Does report generalized bodyaches. No leg pain or leg swelling, recent travel, or recent surgeries. No abdominal pain, nausea, vomiting, or diarrhea.   The history is provided by the patient. No language interpreter was used.  Chest Pain Associated symptoms: cough        Prior to Admission medications   Medication Sig Start Date End Date Taking? Authorizing Provider  doxycycline  (VIBRAMYCIN ) 100 MG capsule Take 1 capsule (100 mg total) by mouth 2 (two) times daily. Patient not taking: Reported on 05/01/2023 08/17/22   Blaise Aleene KIDD, MD  norelgestromin -ethinyl estradiol  (XULANE) 150-35 MCG/24HR transdermal patch Place 1 patch onto the skin once a week. 06/26/23   Lola Donnice HERO, MD    Allergies: Patient has no known allergies.    Review of Systems  HENT:  Positive for congestion.   Respiratory:  Positive for cough.   Cardiovascular:  Positive for chest pain.  All other systems reviewed and are negative.   Updated Vital Signs BP 117/85 (BP Location: Left Arm)   Pulse 95   Temp 98.3 F (36.8 C) (Oral)   Resp 18   SpO2 99%   Physical Exam Vitals and nursing note reviewed.  Constitutional:      General: She is not in acute distress.    Appearance: Normal appearance. She is normal weight. She is not ill-appearing, toxic-appearing or diaphoretic.  HENT:      Head: Normocephalic and atraumatic.  Cardiovascular:     Rate and Rhythm: Normal rate and regular rhythm.     Heart sounds: Normal heart sounds.  Pulmonary:     Effort: Pulmonary effort is normal. No respiratory distress.     Breath sounds: Normal breath sounds.  Chest:     Chest wall: No tenderness.  Abdominal:     Palpations: Abdomen is soft.     Tenderness: There is no abdominal tenderness.  Musculoskeletal:        General: Normal range of motion.     Cervical back: Normal range of motion.     Right lower leg: No tenderness. No edema.     Left lower leg: No tenderness. No edema.  Skin:    General: Skin is warm and dry.  Neurological:     General: No focal deficit present.     Mental Status: She is alert.  Psychiatric:        Mood and Affect: Mood normal.        Behavior: Behavior normal.     (all labs ordered are listed, but only abnormal results are displayed) Labs Reviewed  BASIC METABOLIC PANEL WITH GFR - Abnormal; Notable for the following components:      Result Value   CO2 20 (*)    BUN <5 (*)    Calcium  8.8 (*)    All other components within normal limits  CBC - Abnormal; Notable for the following components:  Hemoglobin 11.9 (*)    All other components within normal limits  RESP PANEL BY RT-PCR (RSV, FLU A&B, COVID)  RVPGX2  HCG, SERUM, QUALITATIVE  TROPONIN T, HIGH SENSITIVITY    EKG: None  Radiology: DG Chest 2 View Result Date: 10/23/2023 CLINICAL DATA:  18 year old female with chest pain cough and congestion. EXAM: DG CHEST 2V COMPARISON:  Portable chest 04/09/2021 and earlier. FINDINGS: Semi upright AP and lateral views of the chest. Improved lung volumes. Normal cardiac size and mediastinal contours. Visualized tracheal air column is within normal limits. Both lungs appear clear. No pneumothorax or pleural effusion. Negative visible bowel gas and osseous structures. IMPRESSION: Negative. No cardiopulmonary abnormality. Electronically Signed   By: VEAR Hurst M.D.   On: 10/23/2023 07:13     Procedures   Medications Ordered in the ED  guaiFENesin -codeine  100-10 MG/5ML solution 5 mL (has no administration in time range)  ibuprofen  (ADVIL ) tablet 800 mg (has no administration in time range)                                    Medical Decision Making Risk OTC drugs. Prescription drug management.   This patient is a 18 y.o. female who presents to the ED for concern of cough, congestion, this involves an extensive number of treatment options, and is a complaint that carries with it a high risk of complications and morbidity. The emergent differential diagnosis prior to evaluation includes, but is not limited to,  URI, pneumonia . This is not an exhaustive differential.   Past Medical History / Co-morbidities / Social History:  has a past medical history of Allergy, COVID-19 (09/21/2019), and Medical history non-contributory.  Additional history: Chart reviewed.  Physical Exam: Physical exam performed. The pertinent findings include: well appearing, no acute physical exam abnormalities  Lab Tests: I ordered, and personally interpreted labs.  The pertinent results include:  no acute laboratory abnormalities   Imaging Studies: I ordered imaging studies including CXR. I independently visualized and interpreted imaging which showed NAD. I agree with the radiologist interpretation.   Cardiac Monitoring:  The patient was maintained on a cardiac monitor.  My attending physician viewed and interpreted the cardiac monitored which showed an underlying rhythm of: sinus rhythm, no STEMI. I agree with this interpretation.   Medications: I ordered medication including ibuprofen , cough syrup  for cough, congestion. Reevaluation of the patient after these medicines showed that the patient improved. I have reviewed the patients home medicines and have made adjustments as needed.   Disposition: After consideration of the diagnostic results and the  patients response to treatment, I feel that emergency department workup does not suggest an emergent condition requiring admission or immediate intervention beyond what has been performed at this time. The plan is: discharge with cough syrup, close outpatient follow-up and return precautions. Patient advised not to drive or operate heavy machinery while taking this medication.  Patients workup is benign, after above interventions she is feeling better and ready to go home.  Patient's symptoms are consistent with a viral illness.  No signs or symptoms to suggest ACS/PE.  Evaluation and diagnostic testing in the emergency department does not suggest an emergent condition requiring admission or immediate intervention beyond what has been performed at this time.  Plan for discharge with close PCP follow-up.  Patient is understanding and amenable with plan, educated on red flag symptoms that would prompt immediate return.  Patient discharged in stable condition.  Final diagnoses:  Viral URI with cough    ED Discharge Orders          Ordered    promethazine -dextromethorphan (PROMETHAZINE -DM) 6.25-15 MG/5ML syrup  4 times daily PRN        10/23/23 0818          An After Visit Summary was printed and given to the patient.      Nora Lauraine LABOR, PA-C 10/23/23 0820    Elnor Jayson LABOR, DO 10/24/23 [phone]

## 2023-10-23 NOTE — ED Triage Notes (Signed)
 Pt. Arrives pov c/o congestion and chest pain. States that she was sick all day yesterday with body aches and sore throat. Today her chest pain woke her out of her sleep. Denies SOB.

## 2023-12-12 IMAGING — US US MFM OB FOLLOW-UP
1 series · 14 of 28 positions shown · non-contrast
Comparison: none

[Series 1: us mfm ob follow-up · 14 of 60 slices shown]
[im 3/60]
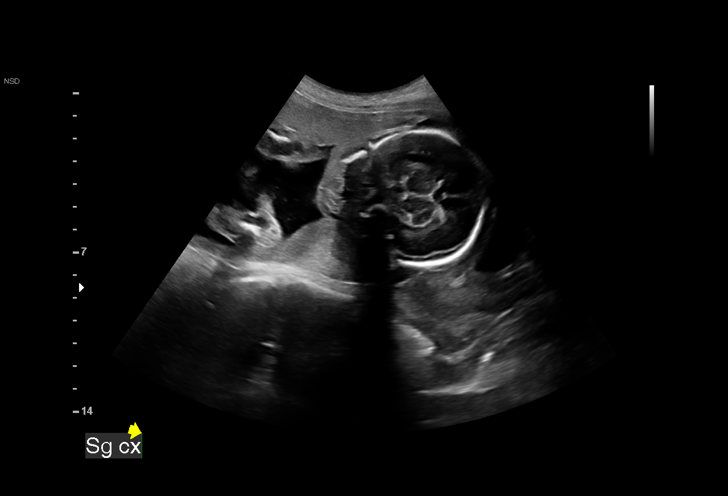
[im 7/60]
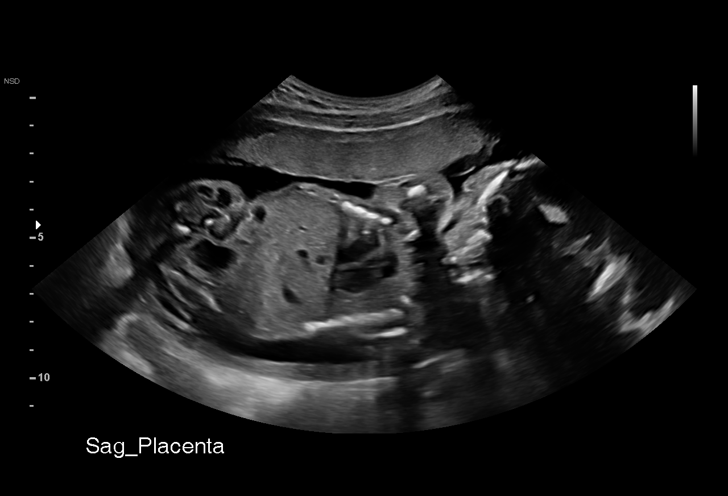
[im 11/60]
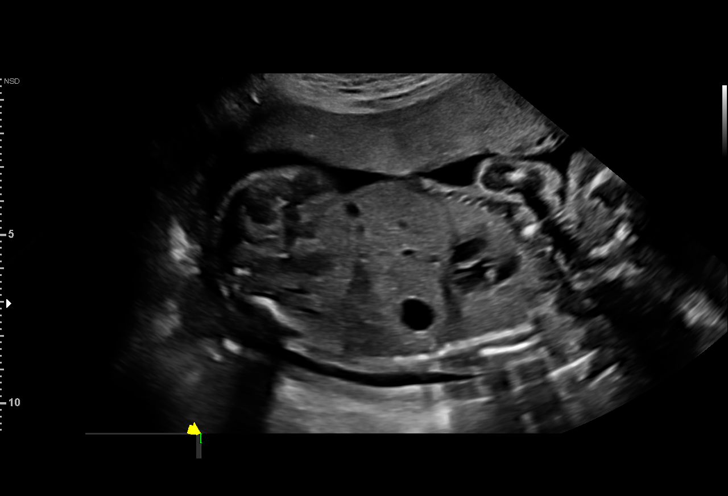
[im 16/60]
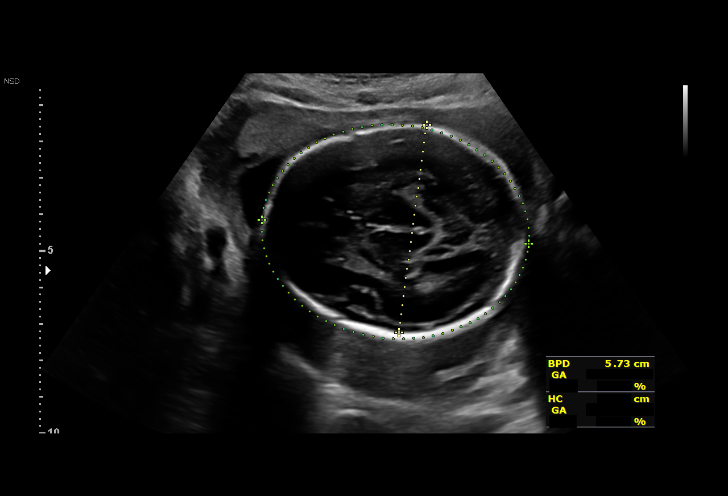
[im 20/60]
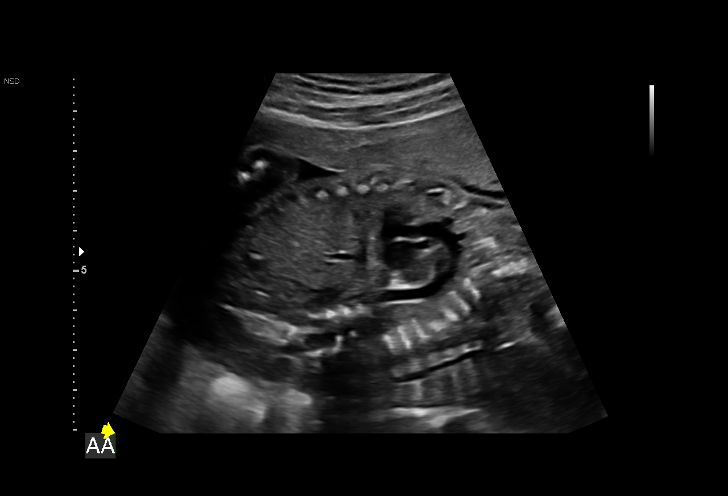
[im 25/60]
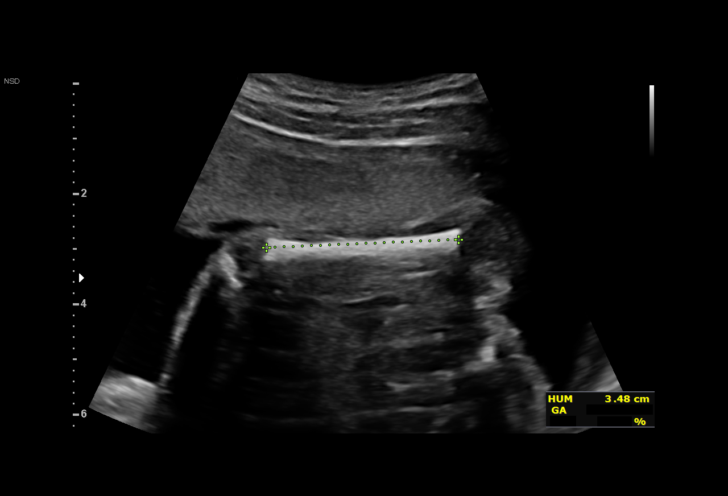
[im 29/60]
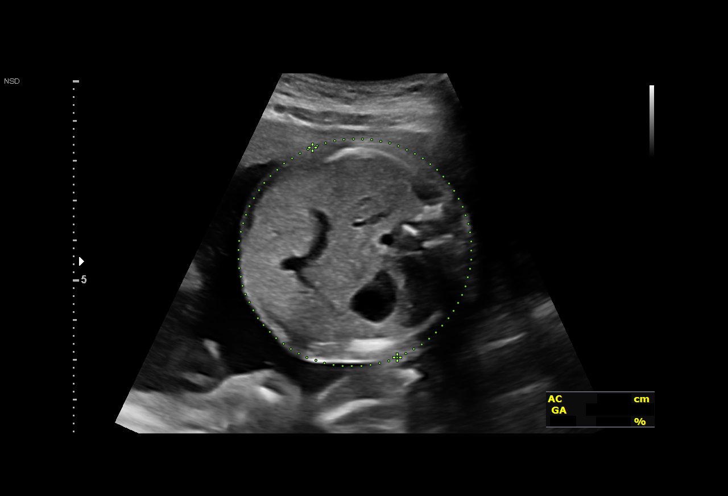
[im 33/60]
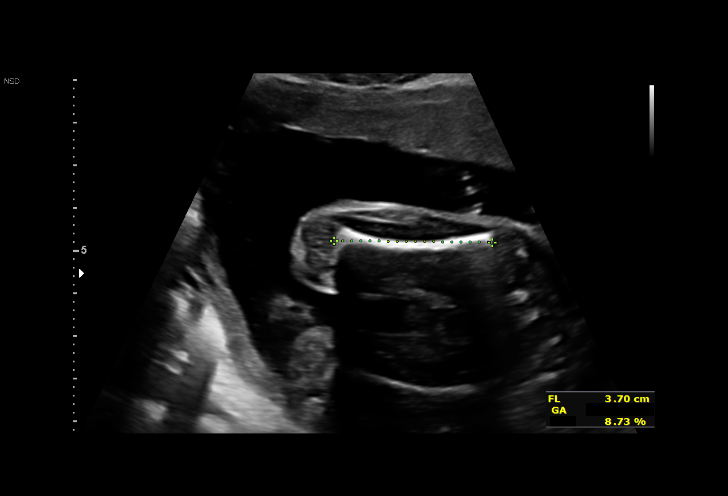
[im 38/60]
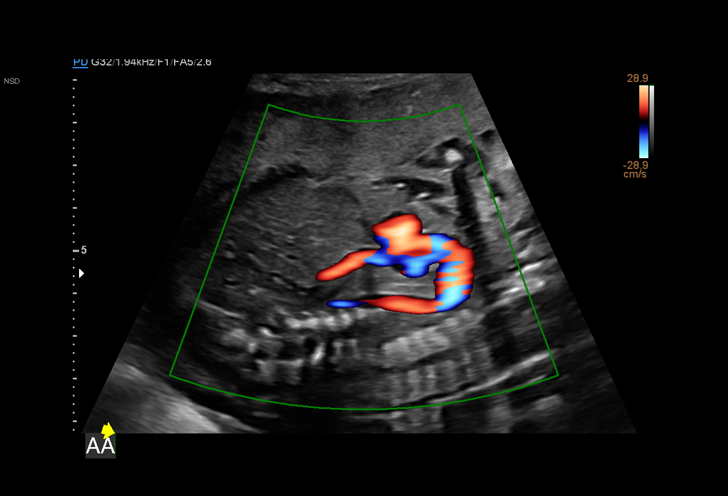
[im 42/60]
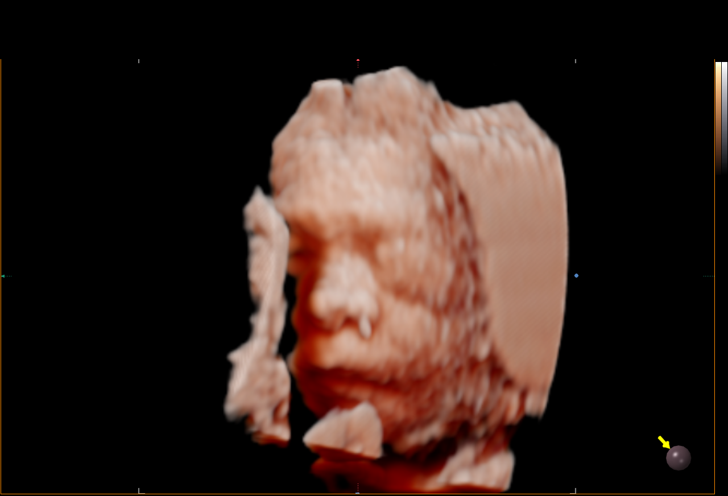
[im 46/60]
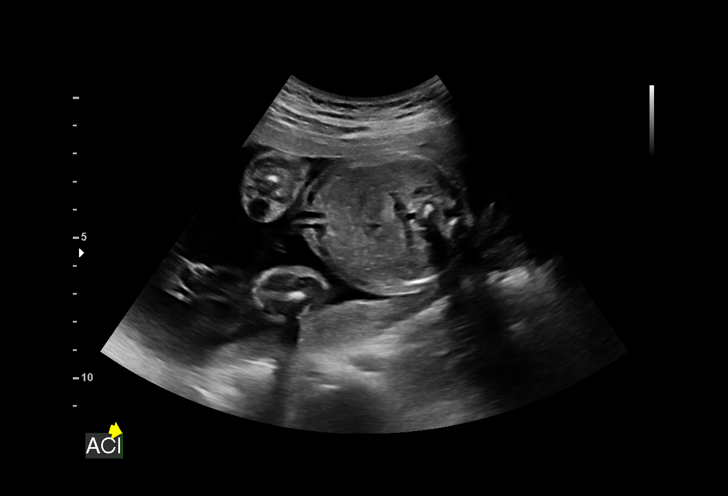
[im 51/60]
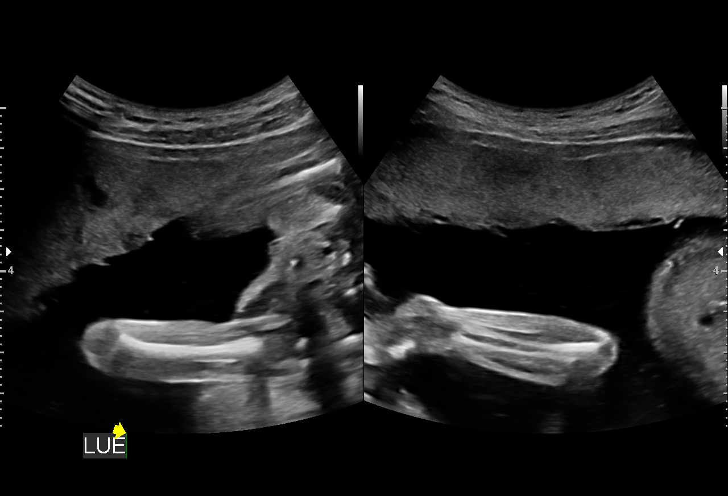
[im 55/60]
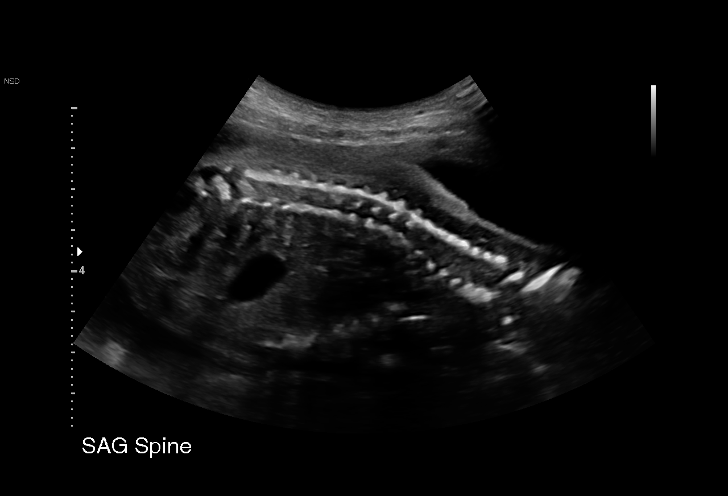
[im 60/60]
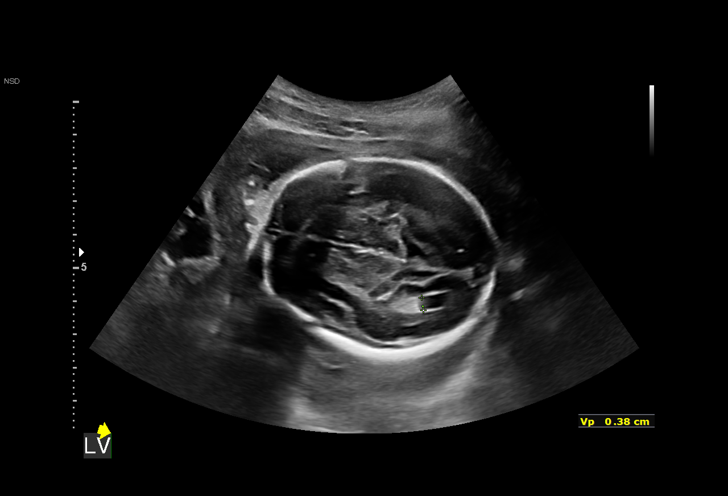

[14 of 28 positions shown; findings below may reference images not displayed]

Indications

 Teen pregnancy
 23 weeks gestation of pregnancy
 Genetic carrier (silent Andee Mauer)
 Antenatal screening for malformations
 Low risk NIPS, neg AFP
Fetal Evaluation

 Num Of Fetuses:         1
 Fetal Heart Rate(bpm):  165
 Cardiac Activity:       Observed
 Presentation:           Cephalic
 Placenta:               Anterior
 P. Cord Insertion:      Previously Visualized

 Amniotic Fluid
 AFI FV:      Within normal limits

                             Largest Pocket(cm)

Biometry

 BPD:      57.4  mm     G. Age:  23w 4d         67  %    CI:        75.87   %    70 - 86
                                                         FL/HC:      18.2   %    19.2 -
 HC:      208.9  mm     G. Age:  23w 0d         34  %    HC/AC:      1.17        1.05 -
 AC:      179.2  mm     G. Age:  22w 6d         35  %    FL/BPD:     66.2   %    71 - 87
 FL:         38  mm     G. Age:  22w 1d         15  %    FL/AC:      21.2   %    20 - 24
 HUM:      35.2  mm     G. Age:  22w 1d         25  %
 LV:        4.5  mm

 Est. FW:     518  gm      1 lb 2 oz     25  %
OB History

 Gravidity:    2         Term:   0        Prem:   0        SAB:   1
 TOP:          0       Ectopic:  0        Living: 0
Gestational Age

 LMP:           23w 3d        Date:  09/24/20                 EDD:   07/01/21
 U/S Today:     22w 6d                                        EDD:   07/05/21
 Best:          23w 0d     Det. By:  Early Ultrasound         EDD:   07/04/21
                                     (11/09/20)
Anatomy

 Cranium:               Appears normal         LVOT:                   Previously seen
 Cavum:                 Previously seen        Aortic Arch:            Appears normal
 Ventricles:            Appears normal         Ductal Arch:            Appears normal
 Choroid Plexus:        Previously seen        Diaphragm:              Appears normal
 Cerebellum:            Previously seen        Stomach:                Appears normal, left
                                                                       sided
 Posterior Fossa:       Previously seen        Abdomen:                Appears normal
 Nuchal Fold:           Previously seen        Abdominal Wall:         Previously seen
 Face:                  Orbits and profile     Cord Vessels:           Previously seen
                        previously seen
 Lips:                  Previously seen        Kidneys:                Appear normal
 Palate:                Not well visualized    Bladder:                Appears normal
 Thoracic:              Appears normal         Spine:                  Previously seen
 Heart:                 Appears normal         Upper Extremities:      Previously seen
                        (4CH, axis, and
                        situs)
 RVOT:                  Previously seen        Lower Extremities:      Previously seen

 Other:  Heels/feet and open hands previously visualized. Nasal bone, lenses
         and 3VV previously visualized.  Technically difficult due to fetal
         position.
Cervix Uterus Adnexa

 Cervix
 Length:           3.92  cm.
 Normal appearance by transabdominal scan.

 Right Ovary
 Previously seen

 Left Ovary
 Previously seen.
Impression

 Follow up growth to complete the fetal anatomy.
 Normal interval growth with measurements consistent with
 dates
 Good fetal movement and amniotic fluid volume

 Anatomy completed today with suboptimal views of the fetal
 palate.
Recommendations

 Follow up growth as clinically indicated.

## 2024-01-14 IMAGING — DX DG CHEST 1V PORT
1 series · 1 of 1 positions shown · non-contrast
Comparison: None.

CLINICAL DATA: Trauma right chest pain

EXAM:
PORTABLE CHEST 1 VIEW

[chest]
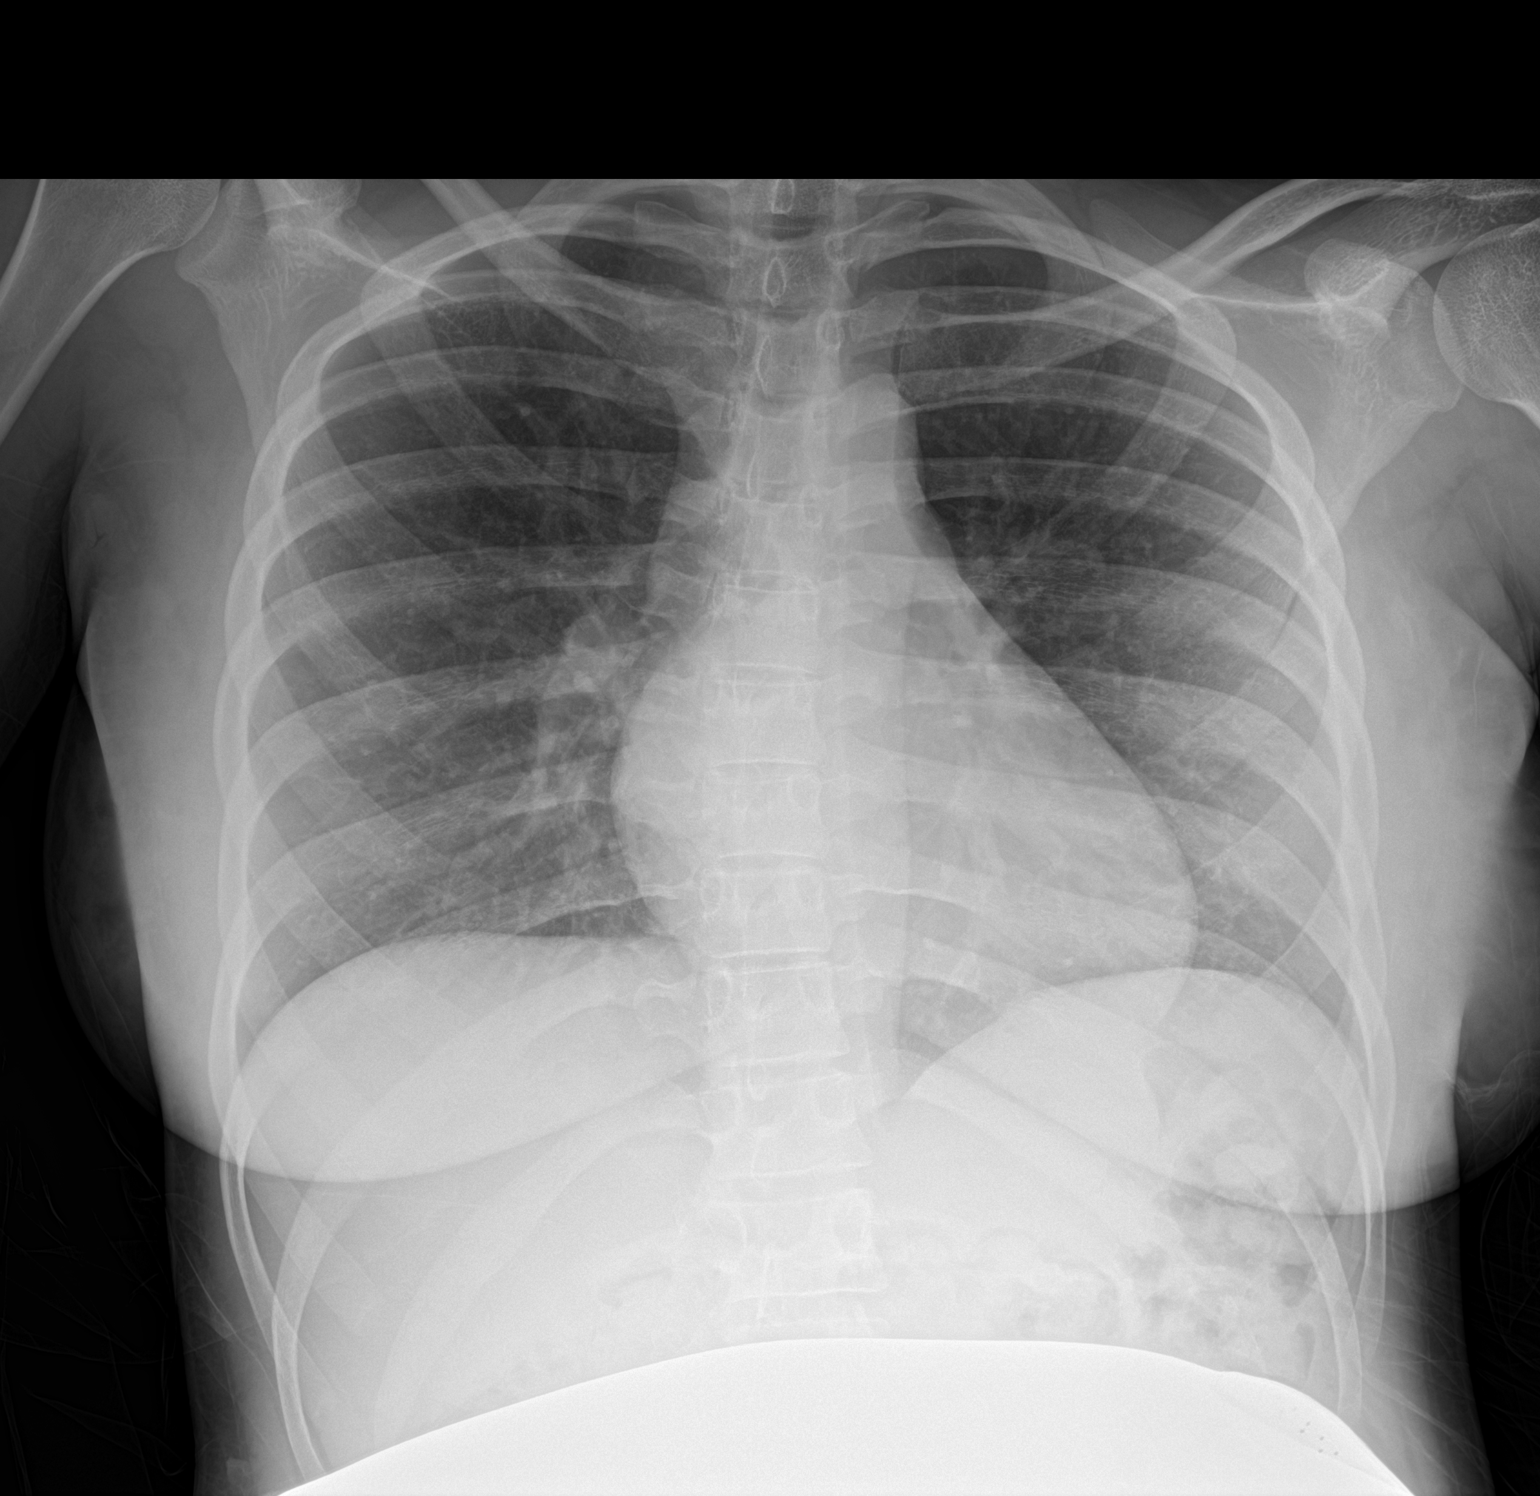

[1 of 1 positions shown; findings below may reference images not displayed]

FINDINGS: The heart size and mediastinal contours are within normal limits.
Both lungs are clear. The visualized skeletal structures are
unremarkable.
IMPRESSION: No active disease.

## 2024-01-21 ENCOUNTER — Telehealth: Payer: Self-pay | Admitting: General Practice

## 2024-01-21 DIAGNOSIS — Z30016 Encounter for initial prescription of transdermal patch hormonal contraceptive device: Secondary | ICD-10-CM

## 2024-01-21 MED ORDER — NORELGESTROMIN-ETH ESTRADIOL 150-35 MCG/24HR TD PTWK: 1.0000 | MEDICATED_PATCH | TRANSDERMAL | 3 refills | Status: AC

## 2024-01-21 NOTE — Telephone Encounter (Signed)
 Patient called and left message on nurse voice mail line requesting birth control patch refill. Refills sent to pharmacy per Dr Eldonna. Called patient, no answer- left message stating her requested refills have been sent to her pharmacy. She may call us  back if needed

## 2024-02-10 ENCOUNTER — Emergency Department (HOSPITAL_COMMUNITY)

## 2024-02-10 ENCOUNTER — Encounter (HOSPITAL_COMMUNITY): Payer: Self-pay

## 2024-02-10 ENCOUNTER — Emergency Department (HOSPITAL_COMMUNITY): Admission: EM | Admit: 2024-02-10 | Discharge: 2024-02-10 | Discharge status: Against Medical Advice

## 2024-02-10 ENCOUNTER — Other Ambulatory Visit: Payer: Self-pay

## 2024-02-10 DIAGNOSIS — J029 Acute pharyngitis, unspecified: Secondary | ICD-10-CM | POA: Insufficient documentation

## 2024-02-10 DIAGNOSIS — Z5321 Procedure and treatment not carried out due to patient leaving prior to being seen by health care provider: Secondary | ICD-10-CM | POA: Diagnosis not present

## 2024-02-10 LAB — PREGNANCY, URINE: Preg Test, Ur: NEGATIVE

## 2024-02-10 LAB — GROUP A STREP BY PCR: Group A Strep by PCR: NOT DETECTED

## 2024-02-10 MED ORDER — IPRATROPIUM-ALBUTEROL 0.5-2.5 (3) MG/3ML IN SOLN
3.0000 mL | Freq: Once | RESPIRATORY_TRACT | Status: AC
  Administered 2024-02-10: 3 mL via RESPIRATORY_TRACT
  Filled 2024-02-10: qty 3

## 2024-02-10 MED ORDER — PREDNISONE 20 MG PO TABS
60.0000 mg | ORAL_TABLET | Freq: Every day | ORAL | Status: DC
  Administered 2024-02-10: 60 mg via ORAL
  Filled 2024-02-10: qty 3

## 2024-02-10 MED ORDER — ACETAMINOPHEN 500 MG PO TABS
1000.0000 mg | ORAL_TABLET | Freq: Once | ORAL | Status: AC
  Administered 2024-02-10: 1000 mg via ORAL
  Filled 2024-02-10: qty 2

## 2024-02-10 NOTE — ED Provider Triage Note (Addendum)
 Emergency Medicine Provider Triage Evaluation Note  Jaclyn Andrews , a 18 y.o. female  was evaluated in triage.  Pt complains of cough, congestion, shortness of breath as well as flu-like symptoms. No known history of asthma or COPD. Patient's child has recently been sick. Denies significant past medical history and takes no meds at home.   Review of Systems  Positive: Shortness of breath, wheezing, fever, sneezing, congestion Negative: Nausea, vomiting, diarrhea  Physical Exam  BP 114/80 (BP Location: Right Arm)   Pulse 81   Temp 98.4 F (36.9 C) (Oral)   Resp 16   Ht 4' 11 (1.499 m)   Wt 48.5 kg   SpO2 100%   BMI 21.61 kg/m  Gen:   Awake, no distress   Resp:  Normal effort, generalized wheezing, talking in full sentences MSK:   Moves extremities without difficulty  Other:    Medical Decision Making  Medically screening exam initiated at 10:34 AM.  Appropriate orders placed.  Jaclyn Andrews was informed that the remainder of the evaluation will be completed by another provider, this initial triage assessment does not replace that evaluation, and the importance of remaining in the ED until their evaluation is complete.  Orders: Strep, urine preg, CXR, duonebs, tylenol , prednisone    Jaclyn Terrall FALCON, PA-C 02/10/24 1037    Jaclyn Andrews, [place] 02/10/24 1038

## 2024-02-10 NOTE — ED Triage Notes (Signed)
 Patient has had a sore throat, congestion, fever since last night. Has been sneezing and has body aches. Has not been around anyone sick.

## 2024-03-17 ENCOUNTER — Encounter (HOSPITAL_COMMUNITY): Payer: Self-pay

## 2024-03-17 ENCOUNTER — Emergency Department (HOSPITAL_COMMUNITY)
Admission: EM | Admit: 2024-03-17 | Discharge: 2024-03-17 | Disposition: A | Discharge status: Home / Self Care | Attending: Emergency Medicine | Admitting: Emergency Medicine

## 2024-03-17 ENCOUNTER — Emergency Department (HOSPITAL_COMMUNITY)

## 2024-03-17 ENCOUNTER — Other Ambulatory Visit: Payer: Self-pay

## 2024-03-17 DIAGNOSIS — S5012XA Contusion of left forearm, initial encounter: Secondary | ICD-10-CM | POA: Insufficient documentation

## 2024-03-17 DIAGNOSIS — S4992XA Unspecified injury of left shoulder and upper arm, initial encounter: Secondary | ICD-10-CM

## 2024-03-17 DIAGNOSIS — W232XXA Caught, crushed, jammed or pinched between a moving and stationary object, initial encounter: Secondary | ICD-10-CM | POA: Insufficient documentation

## 2024-03-17 MED ORDER — HYDROCODONE-ACETAMINOPHEN 5-325 MG PO TABS
1.0000 | ORAL_TABLET | Freq: Once | ORAL | Status: AC
  Administered 2024-03-17: 1 via ORAL
  Filled 2024-03-17: qty 1

## 2024-03-17 NOTE — ED Notes (Signed)
 Pt d/c home per EDP order. Discharge summary reviewed, verbalize understanding. NAD. Reports visitor is d/c ride home

## 2024-03-17 NOTE — ED Notes (Signed)
"  X-ray to bedside  "
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
## 2024-03-17 NOTE — Discharge Instructions (Addendum)
 Take Motrin  and Tylenol  as instructed for pain. You can apply ice over a thin cloth for 20 minutes at a time to help with pain and swelling. Recheck with your doctor in 1 week if not improving.

## 2024-03-17 NOTE — ED Provider Notes (Signed)
 " [place] [facility] AT [facility] Provider Note   CSN: [id number] Arrival date & time: 03/17/24  1849     Patient presents with: Arm Injury (left)   Jaclyn Andrews is a 19 y.o. female.   19 year old female presents with left arm injury.  Patient states that the car door was open when she opened another car door and pinched her arm between the 2 car doors.  She has pain in her mid to distal left forearm.  No other injuries.       Prior to Admission medications  Medication Sig Start Date End Date Taking? Authorizing Provider  doxycycline  (VIBRAMYCIN ) 100 MG capsule Take 1 capsule (100 mg total) by mouth 2 (two) times daily. Patient not taking: Reported on 05/01/2023 08/17/22   Jaclyn Aleene KIDD, MD  norelgestromin -ethinyl estradiol  (XULANE) 150-35 MCG/24HR transdermal patch Place 1 patch onto the skin once a week. 01/21/24   Jaclyn Suzen Octave, MD  promethazine -dextromethorphan (PROMETHAZINE -DM) 6.25-15 MG/5ML syrup Take 5 mLs by mouth 4 (four) times daily as needed for cough. 10/23/23   Smoot, Lauraine LABOR, PA-C    Allergies: Patient has no known allergies.    Review of Systems Negative except as per HPI Updated Vital Signs BP 101/78 (BP Location: Left Arm)   Pulse 92   Temp 98.4 F (36.9 C) (Oral)   Resp 16   Ht 5' (1.524 m)   Wt 49.9 kg   LMP 03/17/2024   SpO2 100%   BMI 21.48 kg/m   Physical Exam Vitals and nursing note reviewed.  Constitutional:      General: She is not in acute distress.    Appearance: She is well-developed. She is not diaphoretic.  HENT:     Head: Normocephalic and atraumatic.  Cardiovascular:     Pulses: Normal pulses.  Pulmonary:     Effort: Pulmonary effort is normal.  Musculoskeletal:        General: Swelling, tenderness and signs of injury present.       Arms:     Comments: TTP with swelling at mid to distal left forearm. No sensory or motor deficit. Ring removed from left 3rd finger without difficulty    Skin:    General: Skin is warm and dry.     Findings: No erythema or rash.  Neurological:     Mental Status: She is alert and oriented to person, place, and time.  Psychiatric:        Behavior: Behavior normal.     (all labs ordered are listed, but only abnormal results are displayed) Labs Reviewed - No data to display  EKG: None  Radiology: DG Forearm Left Result Date: 03/17/2024 CLINICAL DATA:  Left arm pain after injury.  Swelling. EXAM: LEFT FOREARM - 2 VIEW COMPARISON:  None Available. FINDINGS: There is no evidence of fracture or other focal bone lesions. No wrist or elbow dislocation. Mild soft tissue edema. No radiopaque foreign body or soft tissue gas. IMPRESSION: Mild soft tissue edema. No fracture or subluxation. Electronically Signed   By: Andrea Gasman M.D.   On: 03/17/2024 19:25     Procedures   Medications Ordered in the ED  HYDROcodone -acetaminophen  (NORCO/VICODIN) 5-325 MG per tablet 1 tablet (1 tablet Oral Given 03/17/24 1914)                                    Medical Decision Making Amount and/or Complexity of  Data Reviewed Radiology: ordered.  Risk Prescription drug management.   19 year old female presents with complaint of pain in her left arm after she opened a car door and struck her arm into another car door.  She has some swelling and tenderness of the mid to distal left forearm over her radius without sensory or motor deficit.  X-ray of the left forearm is ordered her myself is negative for acute bony abnormality.  Agree with radiology interpretation.  Recommend ice and elevate for 20 minutes at a time.  Motrin  and Tylenol  as needed directed.     Final diagnoses:  Injury of left upper extremity, initial encounter  Contusion of left forearm, initial encounter    ED Discharge Orders     None          Jaclyn Leita LABOR, PA-C 03/17/24 1933    Jaclyn Share, DO 03/17/24 1944  "
